# Patient Record
Sex: Female | Born: 1945 | Race: White | Hispanic: No | Marital: Married | Smoking: Former smoker
Health system: Southern US, Community
[De-identification: ages and names within clinical notes are randomized; demographics above are authoritative.]

## PROBLEM LIST (undated history)

## (undated) DIAGNOSIS — D649 Anemia, unspecified: Secondary | ICD-10-CM

## (undated) DIAGNOSIS — R51 Headache: Secondary | ICD-10-CM

## (undated) DIAGNOSIS — G4730 Sleep apnea, unspecified: Secondary | ICD-10-CM

## (undated) DIAGNOSIS — K5730 Diverticulosis of large intestine without perforation or abscess without bleeding: Secondary | ICD-10-CM

## (undated) DIAGNOSIS — F419 Anxiety disorder, unspecified: Secondary | ICD-10-CM

## (undated) DIAGNOSIS — Z9889 Other specified postprocedural states: Secondary | ICD-10-CM

## (undated) DIAGNOSIS — L409 Psoriasis, unspecified: Secondary | ICD-10-CM

## (undated) DIAGNOSIS — M8580 Other specified disorders of bone density and structure, unspecified site: Secondary | ICD-10-CM

## (undated) DIAGNOSIS — K219 Gastro-esophageal reflux disease without esophagitis: Secondary | ICD-10-CM

## (undated) DIAGNOSIS — R112 Nausea with vomiting, unspecified: Secondary | ICD-10-CM

## (undated) DIAGNOSIS — Z8489 Family history of other specified conditions: Secondary | ICD-10-CM

## (undated) DIAGNOSIS — E278 Other specified disorders of adrenal gland: Secondary | ICD-10-CM

## (undated) DIAGNOSIS — M1990 Unspecified osteoarthritis, unspecified site: Secondary | ICD-10-CM

## (undated) DIAGNOSIS — Z87442 Personal history of urinary calculi: Secondary | ICD-10-CM

## (undated) DIAGNOSIS — E785 Hyperlipidemia, unspecified: Secondary | ICD-10-CM

## (undated) DIAGNOSIS — R21 Rash and other nonspecific skin eruption: Secondary | ICD-10-CM

## (undated) DIAGNOSIS — H269 Unspecified cataract: Secondary | ICD-10-CM

## (undated) DIAGNOSIS — I10 Essential (primary) hypertension: Secondary | ICD-10-CM

## (undated) DIAGNOSIS — Z8679 Personal history of other diseases of the circulatory system: Secondary | ICD-10-CM

## (undated) HISTORY — DX: Unspecified cataract: H26.9

## (undated) HISTORY — PX: CHOLECYSTECTOMY: SHX55

## (undated) HISTORY — PX: CYSTOSTOMY W/ BLADDER BIOPSY: SHX1431

## (undated) HISTORY — PX: OTHER SURGICAL HISTORY: SHX169

## (undated) HISTORY — DX: Other specified disorders of bone density and structure, unspecified site: M85.80

## (undated) HISTORY — DX: Hyperlipidemia, unspecified: E78.5

## (undated) HISTORY — DX: Psoriasis, unspecified: L40.9

## (undated) HISTORY — DX: Headache: R51

## (undated) HISTORY — PX: DILATION AND CURETTAGE OF UTERUS: SHX78

## (undated) HISTORY — DX: Gastro-esophageal reflux disease without esophagitis: K21.9

## (undated) HISTORY — DX: Unspecified osteoarthritis, unspecified site: M19.90

## (undated) HISTORY — DX: Anemia, unspecified: D64.9

## (undated) HISTORY — DX: Sleep apnea, unspecified: G47.30

## (undated) HISTORY — DX: Diverticulosis of large intestine without perforation or abscess without bleeding: K57.30

---

## 2000-06-24 HISTORY — PX: BLEPHAROPLASTY: SUR158

## 2001-06-24 HISTORY — PX: BREAST EXCISIONAL BIOPSY: SUR124

## 2003-11-17 ENCOUNTER — Other Ambulatory Visit: Admission: RE | Admit: 2003-11-17 | Discharge: 2003-11-17 | Payer: Self-pay | Admitting: Obstetrics and Gynecology

## 2004-07-13 ENCOUNTER — Ambulatory Visit: Payer: Self-pay | Admitting: Family Medicine

## 2004-07-16 ENCOUNTER — Ambulatory Visit: Payer: Self-pay | Admitting: Family Medicine

## 2004-08-22 ENCOUNTER — Encounter: Admission: RE | Admit: 2004-08-22 | Discharge: 2004-08-22 | Payer: Self-pay

## 2005-04-17 ENCOUNTER — Ambulatory Visit: Payer: Self-pay | Admitting: Family Medicine

## 2005-04-24 ENCOUNTER — Ambulatory Visit: Payer: Self-pay | Admitting: Family Medicine

## 2005-08-29 ENCOUNTER — Other Ambulatory Visit: Admission: RE | Admit: 2005-08-29 | Discharge: 2005-08-29 | Payer: Self-pay | Admitting: Obstetrics and Gynecology

## 2005-08-30 ENCOUNTER — Ambulatory Visit: Payer: Self-pay | Admitting: Family Medicine

## 2005-09-11 ENCOUNTER — Encounter: Admission: RE | Admit: 2005-09-11 | Discharge: 2005-09-11 | Payer: Self-pay | Admitting: Obstetrics and Gynecology

## 2005-09-25 ENCOUNTER — Ambulatory Visit: Payer: Self-pay | Admitting: Family Medicine

## 2005-09-30 ENCOUNTER — Encounter: Admission: RE | Admit: 2005-09-30 | Discharge: 2005-09-30 | Payer: Self-pay | Admitting: Obstetrics and Gynecology

## 2005-10-18 ENCOUNTER — Encounter (INDEPENDENT_AMBULATORY_CARE_PROVIDER_SITE_OTHER): Payer: Self-pay | Admitting: *Deleted

## 2005-10-18 ENCOUNTER — Encounter: Admission: RE | Admit: 2005-10-18 | Discharge: 2005-10-18 | Payer: Self-pay | Admitting: Obstetrics and Gynecology

## 2005-10-24 ENCOUNTER — Ambulatory Visit: Payer: Self-pay | Admitting: Family Medicine

## 2006-04-08 ENCOUNTER — Ambulatory Visit: Payer: Self-pay | Admitting: Gastroenterology

## 2006-06-24 HISTORY — PX: COLONOSCOPY: SHX174

## 2006-07-03 ENCOUNTER — Encounter (INDEPENDENT_AMBULATORY_CARE_PROVIDER_SITE_OTHER): Payer: Self-pay | Admitting: *Deleted

## 2006-07-03 ENCOUNTER — Ambulatory Visit: Payer: Self-pay | Admitting: Gastroenterology

## 2006-08-22 ENCOUNTER — Encounter: Admission: RE | Admit: 2006-08-22 | Discharge: 2006-08-22 | Payer: Self-pay | Admitting: Obstetrics and Gynecology

## 2006-09-10 ENCOUNTER — Ambulatory Visit: Payer: Self-pay | Admitting: Family Medicine

## 2006-09-11 LAB — CONVERTED CEMR LAB
BUN: 12 mg/dL (ref 6–23)
CO2: 28 meq/L (ref 19–32)
Calcium: 9.4 mg/dL (ref 8.4–10.5)
Chloride: 97 meq/L (ref 96–112)
GFR calc Af Amer: 82 mL/min
GFR calc non Af Amer: 68 mL/min
Glucose, Bld: 89 mg/dL (ref 70–99)
Potassium: 4.2 meq/L (ref 3.5–5.1)

## 2006-09-16 ENCOUNTER — Encounter: Payer: Self-pay | Admitting: Family Medicine

## 2006-09-16 LAB — CONVERTED CEMR LAB
Norepinephrine 24 Hr Urine: 35 mcg/24hr (ref <80)
Volume, Urine-CORTUR: 3930 mL

## 2006-09-29 ENCOUNTER — Encounter: Admission: RE | Admit: 2006-09-29 | Discharge: 2006-09-29 | Payer: Self-pay | Admitting: Obstetrics and Gynecology

## 2006-11-05 ENCOUNTER — Ambulatory Visit: Payer: Self-pay | Admitting: Family Medicine

## 2006-11-05 LAB — CONVERTED CEMR LAB
Basophils Absolute: 0.1 10*3/uL (ref 0.0–0.1)
Basophils Relative: 0.8 % (ref 0.0–1.0)
Cholesterol: 157 mg/dL (ref 0–200)
Eosinophils Absolute: 0.2 10*3/uL (ref 0.0–0.6)
Eosinophils Relative: 2.4 % (ref 0.0–5.0)
Glucose, 1 Hour GTT: 125 mg/dL (ref 120–170)
Glucose, 2 hour: 105 mg/dL (ref 70–139)
Glucose, Fasting: 106 mg/dL — ABNORMAL HIGH (ref 70–99)
Glucose, GTT - 3 Hour: 100 mg/dL
HCT: 36.8 % (ref 36.0–46.0)
HDL: 42.2 mg/dL (ref >39.0)
Hemoglobin: 12.7 g/dL (ref 12.0–15.0)
LDL Cholesterol: 84 mg/dL (ref 0–99)
Lymphocytes Relative: 31.2 % (ref 12.0–46.0)
MCHC: 34.6 g/dL (ref 30.0–36.0)
MCV: 91.1 fL (ref 78.0–100.0)
Monocytes Absolute: 0.5 10*3/uL (ref 0.2–0.7)
Monocytes Relative: 6.7 % (ref 3.0–11.0)
Neutro Abs: 4.4 10*3/uL (ref 1.4–7.7)
Neutrophils Relative %: 58.9 % (ref 43.0–77.0)
Platelets: 341 10*3/uL (ref 150–400)
RBC: 4.04 M/uL (ref 3.87–5.11)
RDW: 12.2 % (ref 11.5–14.6)
Total CHOL/HDL Ratio: 3.7
Triglycerides: 153 mg/dL — ABNORMAL HIGH (ref 0–149)
VLDL: 31 mg/dL (ref 0–40)
WBC: 7.6 10*3/uL (ref 4.5–10.5)

## 2006-11-10 ENCOUNTER — Encounter: Payer: Self-pay | Admitting: Family Medicine

## 2006-11-10 DIAGNOSIS — L408 Other psoriasis: Secondary | ICD-10-CM

## 2006-11-10 DIAGNOSIS — N63 Unspecified lump in unspecified breast: Secondary | ICD-10-CM | POA: Insufficient documentation

## 2006-11-10 DIAGNOSIS — M6740 Ganglion, unspecified site: Secondary | ICD-10-CM | POA: Insufficient documentation

## 2006-11-10 DIAGNOSIS — M899 Disorder of bone, unspecified: Secondary | ICD-10-CM | POA: Insufficient documentation

## 2006-11-10 DIAGNOSIS — M949 Disorder of cartilage, unspecified: Secondary | ICD-10-CM

## 2006-11-10 DIAGNOSIS — K5730 Diverticulosis of large intestine without perforation or abscess without bleeding: Secondary | ICD-10-CM | POA: Insufficient documentation

## 2006-11-10 DIAGNOSIS — D649 Anemia, unspecified: Secondary | ICD-10-CM

## 2006-11-10 DIAGNOSIS — J45909 Unspecified asthma, uncomplicated: Secondary | ICD-10-CM | POA: Insufficient documentation

## 2006-11-10 DIAGNOSIS — G4730 Sleep apnea, unspecified: Secondary | ICD-10-CM | POA: Insufficient documentation

## 2007-01-26 ENCOUNTER — Ambulatory Visit: Payer: Self-pay | Admitting: Family Medicine

## 2007-01-26 DIAGNOSIS — M810 Age-related osteoporosis without current pathological fracture: Secondary | ICD-10-CM | POA: Insufficient documentation

## 2007-12-22 ENCOUNTER — Telehealth: Payer: Self-pay | Admitting: Family Medicine

## 2007-12-29 ENCOUNTER — Ambulatory Visit: Payer: Self-pay | Admitting: Family Medicine

## 2007-12-29 DIAGNOSIS — R51 Headache: Secondary | ICD-10-CM

## 2007-12-29 DIAGNOSIS — M1990 Unspecified osteoarthritis, unspecified site: Secondary | ICD-10-CM | POA: Insufficient documentation

## 2007-12-29 DIAGNOSIS — I10 Essential (primary) hypertension: Secondary | ICD-10-CM | POA: Insufficient documentation

## 2007-12-29 DIAGNOSIS — Z87442 Personal history of urinary calculi: Secondary | ICD-10-CM | POA: Insufficient documentation

## 2007-12-29 DIAGNOSIS — R519 Headache, unspecified: Secondary | ICD-10-CM | POA: Insufficient documentation

## 2007-12-29 DIAGNOSIS — E785 Hyperlipidemia, unspecified: Secondary | ICD-10-CM

## 2008-01-08 ENCOUNTER — Ambulatory Visit: Payer: Self-pay | Admitting: Family Medicine

## 2008-01-11 LAB — CONVERTED CEMR LAB
Bilirubin Urine: NEGATIVE
Glucose, Urine, Semiquant: NEGATIVE
Ketones, urine, test strip: NEGATIVE
Nitrite: NEGATIVE
Protein, U semiquant: NEGATIVE
Specific Gravity, Urine: 1.01
Urobilinogen, UA: 0.2
pH: 7

## 2008-01-18 LAB — CONVERTED CEMR LAB
ALT: 19 units/L (ref 0–35)
AST: 23 units/L (ref 0–37)
Albumin: 4.1 g/dL (ref 3.5–5.2)
Alkaline Phosphatase: 64 units/L (ref 39–117)
BUN: 10 mg/dL (ref 6–23)
Basophils Absolute: 0.1 10*3/uL (ref 0.0–0.1)
Basophils Relative: 1.2 % (ref 0.0–3.0)
Bilirubin, Direct: 0.1 mg/dL (ref 0.0–0.3)
CO2: 30 meq/L (ref 19–32)
Calcium: 9.7 mg/dL (ref 8.4–10.5)
Chloride: 97 meq/L (ref 96–112)
Cholesterol: 168 mg/dL (ref 0–200)
Creatinine, Ser: 1 mg/dL (ref 0.4–1.2)
Eosinophils Absolute: 0.1 10*3/uL (ref 0.0–0.7)
Eosinophils Relative: 2.2 % (ref 0.0–5.0)
GFR calc Af Amer: 73 mL/min
GFR calc non Af Amer: 60 mL/min
Glucose, Bld: 101 mg/dL — ABNORMAL HIGH (ref 70–99)
HCT: 38.8 % (ref 36.0–46.0)
HDL: 40.9 mg/dL (ref >39.0)
Hemoglobin: 13.3 g/dL (ref 12.0–15.0)
LDL Cholesterol: 97 mg/dL (ref 0–99)
Lymphocytes Relative: 33.5 % (ref 12.0–46.0)
MCHC: 34.3 g/dL (ref 30.0–36.0)
MCV: 94 fL (ref 78.0–100.0)
Monocytes Absolute: 0.4 10*3/uL (ref 0.1–1.0)
Monocytes Relative: 8.1 % (ref 3.0–12.0)
Neutro Abs: 3.1 10*3/uL (ref 1.4–7.7)
Neutrophils Relative %: 55 % (ref 43.0–77.0)
Platelets: 368 10*3/uL (ref 150–400)
Potassium: 4.2 meq/L (ref 3.5–5.1)
RBC: 4.13 M/uL (ref 3.87–5.11)
RDW: 12 % (ref 11.5–14.6)
Sodium: 134 meq/L — ABNORMAL LOW (ref 135–145)
TSH: 3.66 microintl units/mL (ref 0.35–5.50)
Total Bilirubin: 0.9 mg/dL (ref 0.3–1.2)
Total CHOL/HDL Ratio: 4.1
Total Protein: 7.5 g/dL (ref 6.0–8.3)
Triglycerides: 152 mg/dL — ABNORMAL HIGH (ref 0–149)
VLDL: 30 mg/dL (ref 0–40)
WBC: 5.5 10*3/uL (ref 4.5–10.5)

## 2008-03-22 ENCOUNTER — Encounter: Payer: Self-pay | Admitting: *Deleted

## 2008-03-29 ENCOUNTER — Encounter: Admission: RE | Admit: 2008-03-29 | Discharge: 2008-03-29 | Payer: Self-pay | Admitting: Obstetrics and Gynecology

## 2008-10-03 ENCOUNTER — Ambulatory Visit: Payer: Self-pay | Admitting: Family Medicine

## 2008-10-03 DIAGNOSIS — J209 Acute bronchitis, unspecified: Secondary | ICD-10-CM

## 2009-03-13 ENCOUNTER — Ambulatory Visit: Payer: Self-pay | Admitting: Family Medicine

## 2009-03-15 ENCOUNTER — Telehealth: Payer: Self-pay | Admitting: Family Medicine

## 2009-03-30 ENCOUNTER — Ambulatory Visit: Payer: Self-pay | Admitting: Family Medicine

## 2009-03-30 ENCOUNTER — Encounter: Admission: RE | Admit: 2009-03-30 | Discharge: 2009-03-30 | Payer: Self-pay | Admitting: Obstetrics and Gynecology

## 2009-04-04 ENCOUNTER — Ambulatory Visit: Payer: Self-pay | Admitting: Family Medicine

## 2009-04-04 LAB — CONVERTED CEMR LAB
ALT: 17 units/L (ref 0–35)
AST: 24 units/L (ref 0–37)
Albumin: 4.3 g/dL (ref 3.5–5.2)
Alkaline Phosphatase: 58 units/L (ref 39–117)
BUN: 13 mg/dL (ref 6–23)
Basophils Absolute: 0 10*3/uL (ref 0.0–0.1)
Basophils Relative: 0.6 % (ref 0.0–3.0)
Bilirubin Urine: NEGATIVE
Bilirubin, Direct: 0.1 mg/dL (ref 0.0–0.3)
CO2: 29 meq/L (ref 19–32)
Calcium: 9.6 mg/dL (ref 8.4–10.5)
Chloride: 99 meq/L (ref 96–112)
Cholesterol: 143 mg/dL (ref 0–200)
Creatinine, Ser: 1 mg/dL (ref 0.4–1.2)
Eosinophils Absolute: 0.1 10*3/uL (ref 0.0–0.7)
Eosinophils Relative: 1.6 % (ref 0.0–5.0)
GFR calc non Af Amer: 59.77 mL/min (ref >60)
Glucose, Bld: 93 mg/dL (ref 70–99)
Glucose, Urine, Semiquant: NEGATIVE
HCT: 36.6 % (ref 36.0–46.0)
HDL: 46.2 mg/dL (ref >39.00)
Hemoglobin: 12.8 g/dL (ref 12.0–15.0)
Ketones, urine, test strip: NEGATIVE
LDL Cholesterol: 75 mg/dL (ref 0–99)
Lymphocytes Relative: 35.7 % (ref 12.0–46.0)
Lymphs Abs: 2 10*3/uL (ref 0.7–4.0)
MCHC: 35 g/dL (ref 30.0–36.0)
MCV: 95.4 fL (ref 78.0–100.0)
Monocytes Absolute: 0.4 10*3/uL (ref 0.1–1.0)
Monocytes Relative: 7.1 % (ref 3.0–12.0)
Neutro Abs: 3 10*3/uL (ref 1.4–7.7)
Neutrophils Relative %: 55 % (ref 43.0–77.0)
Nitrite: NEGATIVE
Platelets: 338 10*3/uL (ref 150.0–400.0)
Potassium: 3.8 meq/L (ref 3.5–5.1)
Protein, U semiquant: NEGATIVE
RBC: 3.84 M/uL — ABNORMAL LOW (ref 3.87–5.11)
RDW: 11.8 % (ref 11.5–14.6)
Sodium: 133 meq/L — ABNORMAL LOW (ref 135–145)
Specific Gravity, Urine: 1.01
TSH: 2.35 microintl units/mL (ref 0.35–5.50)
Total Bilirubin: 0.7 mg/dL (ref 0.3–1.2)
Total CHOL/HDL Ratio: 3
Total Protein: 7.4 g/dL (ref 6.0–8.3)
Triglycerides: 108 mg/dL (ref 0.0–149.0)
Urobilinogen, UA: 0.2
VLDL: 21.6 mg/dL (ref 0.0–40.0)
WBC: 5.5 10*3/uL (ref 4.5–10.5)
pH: 6

## 2009-04-05 ENCOUNTER — Encounter: Payer: Self-pay | Admitting: Family Medicine

## 2009-04-11 ENCOUNTER — Ambulatory Visit: Payer: Self-pay | Admitting: Family Medicine

## 2009-04-11 ENCOUNTER — Telehealth: Payer: Self-pay | Admitting: Family Medicine

## 2009-04-11 DIAGNOSIS — J069 Acute upper respiratory infection, unspecified: Secondary | ICD-10-CM | POA: Insufficient documentation

## 2009-06-09 ENCOUNTER — Emergency Department (HOSPITAL_COMMUNITY): Admission: EM | Admit: 2009-06-09 | Discharge: 2009-06-09 | Payer: Self-pay | Admitting: Emergency Medicine

## 2009-06-14 HISTORY — PX: OTHER SURGICAL HISTORY: SHX169

## 2009-08-29 ENCOUNTER — Ambulatory Visit: Payer: Self-pay | Admitting: Family Medicine

## 2009-08-29 DIAGNOSIS — T887XXA Unspecified adverse effect of drug or medicament, initial encounter: Secondary | ICD-10-CM | POA: Insufficient documentation

## 2010-02-19 ENCOUNTER — Telehealth: Payer: Self-pay | Admitting: Family Medicine

## 2010-04-02 ENCOUNTER — Encounter: Admission: RE | Admit: 2010-04-02 | Discharge: 2010-04-02 | Payer: Self-pay | Admitting: Obstetrics and Gynecology

## 2010-04-02 ENCOUNTER — Ambulatory Visit: Payer: Self-pay | Admitting: Family Medicine

## 2010-04-04 ENCOUNTER — Ambulatory Visit: Payer: Self-pay | Admitting: Family Medicine

## 2010-04-04 DIAGNOSIS — R319 Hematuria, unspecified: Secondary | ICD-10-CM

## 2010-04-04 LAB — CONVERTED CEMR LAB
Bilirubin Urine: NEGATIVE
Ketones, urine, test strip: NEGATIVE
Nitrite: NEGATIVE
Protein, U semiquant: NEGATIVE
Specific Gravity, Urine: 1.01
Urobilinogen, UA: 0.2
WBC Urine, dipstick: NEGATIVE

## 2010-04-05 LAB — CONVERTED CEMR LAB
ALT: 15 units/L (ref 0–35)
AST: 24 units/L (ref 0–37)
Albumin: 4.1 g/dL (ref 3.5–5.2)
Alkaline Phosphatase: 57 units/L (ref 39–117)
BUN: 16 mg/dL (ref 6–23)
Basophils Relative: 0.8 % (ref 0.0–3.0)
Bilirubin, Direct: 0.1 mg/dL (ref 0.0–0.3)
CO2: 32 meq/L (ref 19–32)
Calcium: 9.6 mg/dL (ref 8.4–10.5)
Chloride: 98 meq/L (ref 96–112)
Cholesterol: 137 mg/dL (ref 0–200)
Eosinophils Absolute: 0.1 10*3/uL (ref 0.0–0.7)
Eosinophils Relative: 1.6 % (ref 0.0–5.0)
GFR calc non Af Amer: 57.57 mL/min (ref >60)
Hemoglobin: 13.1 g/dL (ref 12.0–15.0)
LDL Cholesterol: 67 mg/dL (ref 0–99)
Lymphocytes Relative: 29.4 % (ref 12.0–46.0)
MCHC: 34.5 g/dL (ref 30.0–36.0)
MCV: 96.1 fL (ref 78.0–100.0)
Monocytes Absolute: 0.4 10*3/uL (ref 0.1–1.0)
Neutro Abs: 4.1 10*3/uL (ref 1.4–7.7)
Neutrophils Relative %: 61.8 % (ref 43.0–77.0)
Platelets: 378 10*3/uL (ref 150.0–400.0)
Potassium: 5.2 meq/L — ABNORMAL HIGH (ref 3.5–5.1)
RBC: 3.94 M/uL (ref 3.87–5.11)
Sodium: 137 meq/L (ref 135–145)
Total Bilirubin: 0.6 mg/dL (ref 0.3–1.2)
Total CHOL/HDL Ratio: 3
Total Protein: 7.2 g/dL (ref 6.0–8.3)
VLDL: 19.4 mg/dL (ref 0.0–40.0)
WBC: 6.6 10*3/uL (ref 4.5–10.5)

## 2010-04-30 ENCOUNTER — Encounter: Payer: Self-pay | Admitting: Family Medicine

## 2010-05-30 ENCOUNTER — Encounter: Payer: Self-pay | Admitting: Family Medicine

## 2010-07-05 ENCOUNTER — Telehealth: Payer: Self-pay | Admitting: Gastroenterology

## 2010-07-09 ENCOUNTER — Encounter: Payer: Self-pay | Admitting: Internal Medicine

## 2010-07-23 ENCOUNTER — Encounter (INDEPENDENT_AMBULATORY_CARE_PROVIDER_SITE_OTHER): Payer: Self-pay

## 2010-07-24 ENCOUNTER — Ambulatory Visit
Admission: RE | Admit: 2010-07-24 | Discharge: 2010-07-24 | Payer: Self-pay | Source: Home / Self Care | Attending: Gastroenterology | Admitting: Gastroenterology

## 2010-07-24 ENCOUNTER — Ambulatory Visit
Admission: RE | Admit: 2010-07-24 | Discharge: 2010-07-24 | Payer: Self-pay | Source: Home / Self Care | Attending: Family Medicine | Admitting: Family Medicine

## 2010-07-25 ENCOUNTER — Telehealth (INDEPENDENT_AMBULATORY_CARE_PROVIDER_SITE_OTHER): Payer: Self-pay | Admitting: *Deleted

## 2010-07-26 NOTE — Progress Notes (Signed)
Summary: Pt req to get a script for Eppi-pen for bee stings  Phone Note Call from Patient Call back at Home Phone [phone]   Caller: Patient Summary of Call: Pt called and had gotten stung by some yellow jackets a fews ago, and had a bad reaction to stings. Pt is req to get a script for Eppi-pen. [facility] [address]    Initial call taken by: Lucy Antigua,  February 19, 2010 10:23 AM  Follow-up for Phone Call        please call this in Follow-up by: Nelwyn Salisbury MD,  February 19, 2010 1:16 PM    New/Updated Medications: EPIPEN 0.3 MG/0.3ML DEVI (EPINEPHRINE) UAD Prescriptions: EPIPEN 0.3 MG/0.3ML DEVI (EPINEPHRINE) UAD  #1 x 1   Entered by:   Raechel Ache, RN   Authorized by:   Nelwyn Salisbury MD   Signed by:   Raechel Ache, RN on 02/19/2010   Method used:   [facility]        [facility]  [phone]* (retail)       [address] [place], [place]  [address]       Ph: [phone] or [phone]       Fax: [phone]   RxID:   [id number]

## 2010-07-26 NOTE — Letter (Signed)
Summary: [facility]  [facility]   Imported By: Maryln Gottron 06/07/2010 14:23:06  _____________________________________________________________________  External Attachment:    Type:   Image     Comment:   External Document

## 2010-07-26 NOTE — Letter (Signed)
Summary: Pre Visit Letter Revised  [facility]  [address] [place], [place] [address]   Phone: [phone]  Fax: [phone]        07/09/2010 MRN: [id number] Angela Sheppard [address] [place], [place]  [address]             Procedure Date:  08/01/2010    Welcome to the Gastroenterology Division at [facility].    You are scheduled to see a nurse for your pre-procedure visit on 07/24/10 at 9:00 a.m. on the 3rd floor at [facility], [address]  We ask that you try to arrive at our office 15 minutes prior to your appointment time to allow for check-in.  Please take a minute to review the attached form.  If you answer "Yes" to one or more of the questions on the first page, we ask that you call the person listed at your earliest opportunity.  If you answer "No" to all of the questions, please complete the rest of the form and bring it to your appointment.    Your nurse visit will consist of discussing your medical and surgical history, your immediate family medical history, and your medications.   If you are unable to list all of your medications on the form, please bring the medication bottles to your appointment and we will list them.  We will need to be aware of both prescribed and over the counter drugs.  We will need to know exact dosage information as well.    Please be prepared to read and sign documents such as consent forms, a financial agreement, and acknowledgement forms.  If necessary, and with your consent, a friend or relative is welcome to sit-in on the nurse visit with you.  Please bring your insurance card so that we may make a copy of it.  If your insurance requires a referral to see a specialist, please bring your referral form from your primary care physician.  No co-pay is required for this nurse visit.     If you cannot keep your appointment, please call [phone] to cancel or reschedule prior to your appointment date.  This  allows [place] the opportunity to schedule an appointment for another patient in need of care.    Thank you for choosing [facility] for your medical needs.  We appreciate the opportunity to care for you.  Please visit [place] at our website  to learn more about our practice.  Sincerely, The Gastroenterology Division

## 2010-07-26 NOTE — Consult Note (Signed)
Summary: [facility]  [facility]   Imported By: Maryln Gottron 05/07/2010 15:19:38  _____________________________________________________________________  External Attachment:    Type:   Image     Comment:   External Document

## 2010-07-26 NOTE — Assessment & Plan Note (Signed)
Summary: asthma flare-up//lch   Vital Signs:  Patient profile:   65 year old female Weight:      197 pounds O2 Sat:      92 % Temp:     97.7 degrees F Pulse rate:   76 / minute BP sitting:   130 / 84  (left arm) Cuff size:   large  Vitals Entered By: Pura Spice, RN (April 02, 2010 9:32 AM) CC: asthma flare up ventolin helped needs refill on combivent and ventolin    History of Present Illness: Here for refils. She is doing well except for an occasional spell of wheezing. She gets excellent and prompt relief from Proair at these times. BP is stable.   Allergies: 1)  ! Bactrim (Sulfamethoxazole-Trimethoprim) 2)  ! Penicillin V Potassium (Penicillin V Potassium) 3)  ! Cefdinir (Cefdinir)  Past History:  Past Medical History: Reviewed history from 12/29/2007 and no changes required. Anemia-NOS Osteopenia Asthma Sleep apnea Diverticulosis, colon Headache Nephrolithiasis, hx of Osteoarthritis psoriasis  Review of Systems  The patient denies anorexia, fever, weight loss, weight gain, vision loss, decreased hearing, hoarseness, chest pain, syncope, dyspnea on exertion, peripheral edema, prolonged cough, headaches, hemoptysis, abdominal pain, melena, hematochezia, severe indigestion/heartburn, hematuria, incontinence, genital sores, muscle weakness, suspicious skin lesions, transient blindness, difficulty walking, depression, unusual weight change, abnormal bleeding, enlarged lymph nodes, angioedema, breast masses, and testicular masses.         Flu Vaccine Consent Questions     Do you have a history of severe allergic reactions to this vaccine? no    Any prior history of allergic reactions to egg and/or gelatin? no    Do you have a sensitivity to the preservative Thimersol? no    Do you have a past history of Guillan-Barre Syndrome? no    Do you currently have an acute febrile illness? no    Have you ever had a severe reaction to latex? no    Vaccine information given  and explained to patient? yes    Are you currently pregnant? no    Lot Number:[id number]   Exp Date:12/22/2010   Site Given  Left Deltoid IM Pura Spice, RN  April 02, 2010 12:05 PM   Physical Exam  General:  Well-developed,well-nourished,in no acute distress; alert,appropriate and cooperative throughout examination Neck:  No deformities, masses, or tenderness noted. Lungs:  Normal respiratory effort, chest expands symmetrically. Lungs are clear to auscultation, no crackles or wheezes. Heart:  Normal rate and regular rhythm. S1 and S2 normal without gallop, murmur, click, rub or other extra sounds.   Impression & Recommendations:  Problem # 1:  ESSENTIAL HYPERTENSION (ICD-401.9)  Her updated medication list for this problem includes:    Benazepril-hydrochlorothiazide 20-25 Mg Tabs (Benazepril-hydrochlorothiazide) ..... Once daily  Problem # 2:  ASTHMA (ICD-493.90)  Her updated medication list for this problem includes:    Singulair 10 Mg Tabs (Montelukast sodium) .Marland Kitchen... 1 by mouth daily    Proair Hfa 108 (90 Base) Mcg/act Aers (Albuterol sulfate) .Marland Kitchen... 2 puffs as needed    Combivent 103-18 Mcg/act Aero (Ipratropium-albuterol) ..... Inhale 2 puffs three times a day as needed for sob  Complete Medication List: 1)  Lipitor 20 Mg Tabs (Atorvastatin calcium) .... Once daily 2)  Aspir-low 81 Mg Tbec (Aspirin) .Marland Kitchen.. 1 by mouth once daily 3)  Actonel 150 Mg Tabs (Risedronate sodium) .Marland Kitchen.. 1 a month 4)  Singulair 10 Mg Tabs (Montelukast sodium) .Marland Kitchen.. 1 by mouth daily 5)  Proair Hfa 108 (90 Base)  Mcg/act Aers (Albuterol sulfate) .... 2 puffs as needed 6)  Calcium Carbonate-vitamin D 600-400 Mg-unit Tabs (Calcium carbonate-vitamin d) .Marland Kitchen.. 1 by mouth once daily 7)  Combivent 103-18 Mcg/act Aero (Ipratropium-albuterol) .... Inhale 2 puffs three times a day as needed for sob 8)  Benazepril-hydrochlorothiazide 20-25 Mg Tabs (Benazepril-hydrochlorothiazide) .... Once daily 9)  Epipen 0.3  Mg/0.28ml Devi (Epinephrine) .... Uad  Other Orders: Admin 1st Vaccine (84166) Flu Vaccine 102yrs + ([address])  Patient Instructions: 1)  Please schedule a follow-up appointment as needed .  Prescriptions: COMBIVENT 103-18 MCG/ACT AERO (IPRATROPIUM-ALBUTEROL) inhale 2 puffs three times a day as needed for sob  #30 x 11   Entered and Authorized by:   Nelwyn Salisbury MD   Signed by:   Nelwyn Salisbury MD on 04/02/2010   Method used:   [facility]  [address]  [phone]* (retail)       [address] [place], [place]  [address]       Ph: [phone] or [phone]       Fax: [phone]   RxID:   [id number] PROAIR HFA 108 (90 BASE) MCG/ACT  AERS (ALBUTEROL SULFATE) 2 puffs as needed  #30 x 11   Entered and Authorized by:   Nelwyn Salisbury MD   Signed by:   Nelwyn Salisbury MD on 04/02/2010   Method used:   [facility]        [facility]  [phone]* (retail)       [address] [place], [place]  [address]       Ph: [phone] or [phone]       Fax: [phone]   RxID:   [id number] SINGULAIR 10 MG TABS (MONTELUKAST SODIUM) 1 by mouth daily  #30 x 11   Entered and Authorized by:   Nelwyn Salisbury MD   Signed by:   Nelwyn Salisbury MD on 04/02/2010   Method used:   [facility]        [facility]  [phone]* (retail)       [address] [place], [place]  [address]       Ph: [phone] or [phone]       Fax: [phone]   RxID:   [id number] ACTONEL 150 MG  TABS (RISEDRONATE SODIUM) 1 a month  #1 x 11   Entered and Authorized by:   Nelwyn Salisbury MD   Signed by:   Nelwyn Salisbury MD on 04/02/2010   Method used:   [facility]        [facility]  [phone]* (retail)       [address] [place], [place]  [address]       Ph: [phone] or [phone]       Fax: [phone]   RxID:   [id number] LIPITOR 20 MG TABS (ATORVASTATIN CALCIUM) once daily  #30 x 11   Entered and Authorized by:   Nelwyn Salisbury  MD   Signed by:   Nelwyn Salisbury MD on 04/02/2010   Method used:   [facility]        [facility]  [phone]* (retail)       [address] [place], [place]  [address]       Ph: [phone]  or [phone]       Fax: [phone]   RxID:   [id number]

## 2010-07-26 NOTE — Assessment & Plan Note (Signed)
Summary: er fup-drug interaction//ccm   Vital Signs:  Patient profile:   65 year old female Weight:      197 pounds Temp:     98.1 degrees F oral BP sitting:   126 / 94  (left arm) Cuff size:   regular  Vitals Entered By: Tomma Lightning, RN CC: F/u Urgent Care - has 2 sprained toes. Had CXR done and put on abx, but had reaction. BP has been high.   History of Present Illness: here for 2 reasons. First she has had chesyt congesiton and a dry cough for several weeks. She saw Urgent Care over the weekend and was diagnosed with bronchitis by CXR. Given Cefdinir, but she stopped this after a single dose because of a rash that developed over the face and neck. The rash has disappeared. No fever. Also her BP at home stays in the 90s diastolic while the systolic reading is stable.   Allergies (verified): 1)  ! Bactrim (Sulfamethoxazole-Trimethoprim) 2)  ! Penicillin V Potassium (Penicillin V Potassium) 3)  ! Cefdinir (Cefdinir)  Past History:  Past Medical History: Reviewed history from 12/29/2007 and no changes required. Anemia-NOS Osteopenia Asthma Sleep apnea Diverticulosis, colon Headache Nephrolithiasis, hx of Osteoarthritis psoriasis  Past Surgical History: Cholecystectomy Needle biopsy lt breast and rt breast excision lesion lt ear D&C Blepharoplasty bladder biopsy ganglions lt wrist colonoscopy 2008 per Dr. Russella Dar, repeat in 3 yrs repair of fractured left radius with plate and screws 06-14-09 per Dr. Jodelle Gross  Review of Systems  The patient denies anorexia, fever, weight loss, weight gain, vision loss, decreased hearing, hoarseness, chest pain, syncope, dyspnea on exertion, peripheral edema, headaches, hemoptysis, abdominal pain, melena, hematochezia, severe indigestion/heartburn, hematuria, incontinence, genital sores, muscle weakness, suspicious skin lesions, transient blindness, difficulty walking, depression, unusual weight change, abnormal bleeding, enlarged lymph  nodes, angioedema, breast masses, and testicular masses.    Physical Exam  General:  Well-developed,well-nourished,in no acute distress; alert,appropriate and cooperative throughout examination Head:  Normocephalic and atraumatic without obvious abnormalities. No apparent alopecia or balding. Eyes:  No corneal or conjunctival inflammation noted. EOMI. Perrla. Funduscopic exam benign, without hemorrhages, exudates or papilledema. Vision grossly normal. Ears:  External ear exam shows no significant lesions or deformities.  Otoscopic examination reveals clear canals, tympanic membranes are intact bilaterally without bulging, retraction, inflammation or discharge. Hearing is grossly normal bilaterally. Nose:  External nasal examination shows no deformity or inflammation. Nasal mucosa are pink and moist without lesions or exudates. Mouth:  Oral mucosa and oropharynx without lesions or exudates.  Teeth in good repair. Neck:  No deformities, masses, or tenderness noted. Lungs:  Normal respiratory effort, chest expands symmetrically. Lungs are clear to auscultation, no crackles or wheezes. Skin:  Intact without suspicious lesions or rashes   Impression & Recommendations:  Problem # 1:  ACUTE BRONCHITIS (ICD-466.0)  Her updated medication list for this problem includes:    Singulair 10 Mg Tabs (Montelukast sodium) .Marland Kitchen... 1 by mouth daily    Proair Hfa 108 (90 Base) Mcg/act Aers (Albuterol sulfate) .Marland Kitchen... 2 puffs as needed    Combivent 103-18 Mcg/act Aero (Ipratropium-albuterol) ..... Inhale 2 puffs three times a day as needed for sob    Zithromax Z-pak 250 Mg Tabs (Azithromycin) .Marland Kitchen... As directed  Problem # 2:  ADVERSE DRUG REACTION (ICD-995.20)  Problem # 3:  ESSENTIAL HYPERTENSION (ICD-401.9)  The following medications were removed from the medication list:    Benazepril-hydrochlorothiazide 20-12.5 Mg Tabs (Benazepril-hydrochlorothiazide) .Marland Kitchen... 1 by mouth once daily Her updated  medication list  for this problem includes:    Benazepril-hydrochlorothiazide 20-25 Mg Tabs (Benazepril-hydrochlorothiazide) ..... Once daily  Complete Medication List: 1)  Lipitor 20 Mg Tabs (Atorvastatin calcium) .... Once daily 2)  Aspir-low 81 Mg Tbec (Aspirin) .Marland Kitchen.. 1 by mouth once daily 3)  Actonel 150 Mg Tabs (Risedronate sodium) .Marland Kitchen.. 1 a month 4)  Singulair 10 Mg Tabs (Montelukast sodium) .Marland Kitchen.. 1 by mouth daily 5)  Proair Hfa 108 (90 Base) Mcg/act Aers (Albuterol sulfate) .... 2 puffs as needed 6)  Calcium Carbonate-vitamin D 600-400 Mg-unit Tabs (Calcium carbonate-vitamin d) .Marland Kitchen.. 1 by mouth once daily 7)  Combivent 103-18 Mcg/act Aero (Ipratropium-albuterol) .... Inhale 2 puffs three times a day as needed for sob 8)  Benazepril-hydrochlorothiazide 20-25 Mg Tabs (Benazepril-hydrochlorothiazide) .... Once daily 9)  Zithromax Z-pak 250 Mg Tabs (Azithromycin) .... As directed  Patient Instructions: 1)  Switch to a Zpack. Increase her BP med as above.  2)  Please schedule a follow-up appointment as needed .  Prescriptions: ZITHROMAX Z-PAK 250 MG TABS (AZITHROMYCIN) as directed  #1 x 0   Entered and Authorized by:   Nelwyn Salisbury MD   Signed by:   Nelwyn Salisbury MD on 08/29/2009   Method used:   [facility]        [facility]  [phone]* (retail)       [address] [place], [place]  [address]       Ph: [phone] or [phone]       Fax: [phone]   RxID:   [id number] BENAZEPRIL-HYDROCHLOROTHIAZIDE 20-25 MG TABS (BENAZEPRIL-HYDROCHLOROTHIAZIDE) once daily  #30 x 11   Entered and Authorized by:   Nelwyn Salisbury MD   Signed by:   Nelwyn Salisbury MD on 08/29/2009   Method used:   [facility]        [facility]  [phone]* (retail)       [address] [place], [place]  [address]       Ph: [phone] or [phone]       Fax: [phone]   RxID:   [id number]

## 2010-07-26 NOTE — Progress Notes (Signed)
Summary: ? re recall date   Phone Note Call from Patient Call back at Home Phone [phone]   Caller: Patient Call For: Dr Russella Dar Reason for Call: Talk to Nurse Summary of Call: Patient wants to verify with Dr Russella Dar if she's do for a recall in 3 years or 5 years. States that her paperworks state 3 years but our system has her for 5 years. Initial call taken by: Tawni Levy,  July 05, 2010 1:40 PM  Follow-up for Phone Call        paper chart ordered. Follow-up by: Darcey Nora RN, CGRN,  July 05, 2010 2:47 PM     Appended Document: ? re recall date chart ordered for review  Appended Document: ? re recall date Dr Russella Dar according to her EMR she has a family hx of colon CA.  I have placed the chart in your office for your review.  Please advise colon recall  Appended Document: ? re recall date Given her strong history of colon cancer and history of adenomatous colon polyps we should make the recall for 06/2010, 4 years. Can schedule directly.  Appended Document: ? re recall date Left message for patient to call back    Appended Document: ? re recall date Patient advised of Dr Ardell Isaacs recommendations.  She is requesting propofol.  Is it ok to schedule here with propofol?  She said you mentioned to her in the past she may need deeper sedation.  Appended Document: ? re recall date OK for propofol. I may have slots open on my next propofol day.  Appended Document: ? re recall date recall scheduled for 08/01/10 9:00, pre-visit scheduled for 07/24/10 9:00

## 2010-08-01 ENCOUNTER — Encounter (AMBULATORY_SURGERY_CENTER): Payer: 59 | Admitting: Gastroenterology

## 2010-08-01 ENCOUNTER — Other Ambulatory Visit: Payer: Self-pay | Admitting: Gastroenterology

## 2010-08-01 DIAGNOSIS — Z1211 Encounter for screening for malignant neoplasm of colon: Secondary | ICD-10-CM

## 2010-08-01 DIAGNOSIS — Z800 Family history of malignant neoplasm of digestive organs: Secondary | ICD-10-CM

## 2010-08-01 DIAGNOSIS — D126 Benign neoplasm of colon, unspecified: Secondary | ICD-10-CM

## 2010-08-01 DIAGNOSIS — Z86010 Personal history of colonic polyps: Secondary | ICD-10-CM

## 2010-08-01 LAB — HM COLONOSCOPY

## 2010-08-01 NOTE — Assessment & Plan Note (Signed)
Summary: cough/asthma/cjr   Vital Signs:  Patient profile:   65 year old female Weight:      198 pounds O2 Sat:      97 % Temp:     98.3 degrees F Pulse rate:   82 / minute BP sitting:   130 / 80  (left arm) Cuff size:   large  Vitals Entered By: Pura Spice, RN (July 24, 2010 2:38 PM) CC: wants chest listened to - colonscopy sch 08-01-10  some cough  and wants something  beside lipitor    History of Present Illness: Here to check her asthma and to discuss her Lipitor. She has had some mildly increased wheezing over the winter but she has not felt sick. No fever or cough. Also she was recently switched from Lipitor to generic atorvastatin, and she thinks she is not tolerating this well. It makes her groggy na dshe feels like her mind does not focus as it should. She wants to go back to brand name Lipitor.   Allergies: 1)  ! Bactrim (Sulfamethoxazole-Trimethoprim) 2)  ! Penicillin V Potassium (Penicillin V Potassium) 3)  ! Cefdinir (Cefdinir) 4)  ! * Ivp Dye 5)  ! * Sulfur  Past History:  Past Medical History: Reviewed history from 12/29/2007 and no changes required. Anemia-NOS Osteopenia Asthma Sleep apnea Diverticulosis, colon Headache Nephrolithiasis, hx of Osteoarthritis psoriasis  Review of Systems  The patient denies anorexia, fever, weight loss, weight gain, vision loss, decreased hearing, hoarseness, chest pain, syncope, dyspnea on exertion, peripheral edema, prolonged cough, headaches, hemoptysis, abdominal pain, melena, hematochezia, severe indigestion/heartburn, hematuria, incontinence, genital sores, muscle weakness, suspicious skin lesions, transient blindness, difficulty walking, depression, unusual weight change, abnormal bleeding, enlarged lymph nodes, angioedema, breast masses, and testicular masses.    Physical Exam  General:  overweight-appearing.   Neck:  No deformities, masses, or tenderness noted. Lungs:  Normal respiratory effort, chest  expands symmetrically. Lungs are clear to auscultation, no crackles or wheezes. Heart:  Normal rate and regular rhythm. S1 and S2 normal without gallop, murmur, click, rub or other extra sounds.   Impression & Recommendations:  Problem # 1:  HYPERLIPIDEMIA (ICD-272.4)  Her updated medication list for this problem includes:    Lipitor 20 Mg Tabs (Atorvastatin calcium) ..... Once daily  Problem # 2:  ASTHMA (ICD-493.90)  Her updated medication list for this problem includes:    Singulair 10 Mg Tabs (Montelukast sodium) .Marland Kitchen... 1 by mouth daily    Proair Hfa 108 (90 Base) Mcg/act Aers (Albuterol sulfate) .Marland Kitchen... 2 puffs as needed    Combivent 103-18 Mcg/act Aero (Ipratropium-albuterol) ..... Inhale 2 puffs three times a day as needed for sob  Complete Medication List: 1)  Lipitor 20 Mg Tabs (Atorvastatin calcium) .... Once daily 2)  Aspir-low 81 Mg Tbec (Aspirin) .Marland Kitchen.. 1 by mouth once daily 3)  Actonel 150 Mg Tabs (Risedronate sodium) .Marland Kitchen.. 1 a month 4)  Singulair 10 Mg Tabs (Montelukast sodium) .Marland Kitchen.. 1 by mouth daily 5)  Proair Hfa 108 (90 Base) Mcg/act Aers (Albuterol sulfate) .... 2 puffs as needed 6)  Calcium Carbonate-vitamin D 600-400 Mg-unit Tabs (Calcium carbonate-vitamin d) .Marland Kitchen.. 1 by mouth once daily 7)  Combivent 103-18 Mcg/act Aero (Ipratropium-albuterol) .... Inhale 2 puffs three times a day as needed for sob 8)  Benazepril-hydrochlorothiazide 20-25 Mg Tabs (Benazepril-hydrochlorothiazide) .... Once daily 9)  Epipen 0.3 Mg/0.57ml Devi (Epinephrine) .... Uad 10)  Moviprep 100 Gm Solr (Peg-kcl-nacl-nasulf-na asc-c) .... As per prep instructions.  Patient Instructions: 1)  Her asthma seems to be stable. We will write for her to receive name brand Lipitor only.  Prescriptions: LIPITOR 20 MG TABS (ATORVASTATIN CALCIUM) once daily Brand medically necessary #30 x 11   Entered and Authorized by:   Nelwyn Salisbury MD   Signed by:   Nelwyn Salisbury MD on 07/24/2010   Method used:   Print then  Give to Patient   RxID:   [id number]    Orders Added: 1)  Est. Patient Level IV [[address]782]

## 2010-08-01 NOTE — Progress Notes (Signed)
Summary: Update   Phone Note Call from Patient Call back at Home Phone [phone]   Caller: Patient Call For: Dr. Russella Dar Reason for Call: Talk to Nurse Summary of Call: Pt has an epipen, also she went to see Abran Cantor and she can have procedure as long as she does breathing treatment one hour before procedure Initial call taken by: [place] Johnson,  July 25, 2010 11:14 AM  Follow-up for Phone Call        tried to call pt, now answer.  Will try later.  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  She also wanted [place] to know that she saw Dr. Abran Cantor yesterday.  He said it was okay to have colonoscopy.  Pt will do breathing treatment before coming in for procedure. Follow-up by: Ezra Sites RN,  July 25, 2010 12:27 PM

## 2010-08-01 NOTE — Letter (Signed)
Summary: [facility] Instructions  [facility]  [address] [place], [place] [address]   Phone: [phone]  Fax: [phone]       Angela Sheppard    02-Sep-1945    MRN: [id number]        Procedure Day Dorna Bloom:  Wednesday 08/01/2010     Arrival Time:  8:00 am      Procedure Time:  9:00 am     Location of Procedure:                    _x _  [facility] (4th Floor)                        PREPARATION FOR COLONOSCOPY WITH MOVIPREP   Starting 5 days prior to your procedure Friday 2/3 do not eat nuts, seeds, popcorn, corn, beans, peas,  salads, or any raw vegetables.  Do not take any fiber supplements (e.g. Metamucil, Citrucel, and Benefiber).  THE DAY BEFORE YOUR PROCEDURE         DATE: Tuesday 2/7  1.  Drink clear liquids the entire day-NO SOLID FOOD  2.  Do not drink anything colored red or purple.  Avoid juices with pulp.  No orange juice.  3.  Drink at least 64 oz. (8 glasses) of fluid/clear liquids during the day to prevent dehydration and help the prep work efficiently.  CLEAR LIQUIDS INCLUDE: Water Jello Ice Popsicles Tea (sugar ok, no milk/cream) Powdered fruit flavored drinks Coffee (sugar ok, no milk/cream) Gatorade Juice: apple, white grape, white cranberry  Lemonade Clear bullion, consomm, broth Carbonated beverages (any kind) Strained chicken noodle soup Hard Candy                             4.  In the morning, mix first dose of MoviPrep solution:    Empty 1 Pouch A and 1 Pouch B into the disposable container    Add lukewarm drinking water to the top line of the container. Mix to dissolve    Refrigerate (mixed solution should be used within 24 hrs)  5.  Begin drinking the prep at 5:00 p.m. The MoviPrep container is divided by 4 marks.   Every 15 minutes drink the solution down to the next mark (approximately 8 oz) until the full liter is complete.   6.  Follow completed prep with 16 oz of clear liquid of your choice (Nothing  red or purple).  Continue to drink clear liquids until bedtime.  7.  Before going to bed, mix second dose of MoviPrep solution:    Empty 1 Pouch A and 1 Pouch B into the disposable container    Add lukewarm drinking water to the top line of the container. Mix to dissolve    Refrigerate  THE DAY OF YOUR PROCEDURE      DATE: Wednesday 2/8  Beginning at 4:00 a.m. (5 hours before procedure):         1. Every 15 minutes, drink the solution down to the next mark (approx 8 oz) until the full liter is complete.  2. Follow completed prep with 16 oz. of clear liquid of your choice.    3. You may drink clear liquids until 7:00 am (2 HOURS BEFORE PROCEDURE).   MEDICATION INSTRUCTIONS  Unless otherwise instructed, you should take regular prescription medications with a small sip of water   as early as possible the  morning of your procedure.  Additional medication instructions: Do not take  Benazepril/HCTZ am of priocedure         OTHER INSTRUCTIONS  You will need a responsible adult at least 63 years of age to accompany you and drive you home.   This person must remain in the waiting room during your procedure.  Wear loose fitting clothing that is easily removed.  Leave jewelry and other valuables at home.  However, you may wish to bring a book to read or  an iPod/MP3 player to listen to music as you wait for your procedure to start.  Remove all body piercing jewelry and leave at home.  Total time from sign-in until discharge is approximately 2-3 hours.  You should go home directly after your procedure and rest.  You can resume normal activities the  day after your procedure.  The day of your procedure you should not:   Drive   Make legal decisions   Operate machinery   Drink alcohol   Return to work  You will receive specific instructions about eating, activities and medications before you leave.    The above instructions have been reviewed and explained to me by    Ulis Rias RN  July 24, 2010 9:55 AM     I fully understand and can verbalize these instructions _____________________________ Date _________

## 2010-08-01 NOTE — Miscellaneous (Signed)
Summary: Lec previsit  Clinical Lists Changes  Medications: Added new medication of MOVIPREP 100 GM  SOLR (PEG-KCL-NACL-NASULF-NA ASC-C) As per prep instructions. - Signed Rx of MOVIPREP 100 GM  SOLR (PEG-KCL-NACL-NASULF-NA ASC-C) As per prep instructions.;  #1 x 0;  Signed;  Entered by: Ulis Rias RN;  Authorized by: Meryl Dare MD [facility];  Method used: [facility]  [phone]*, [address], [place], [place]  [address], Ph: [phone] or [phone], Fax: [phone] Allergies: Added new allergy or adverse reaction of * IVP DYE Added new allergy or adverse reaction of * SULFUR    Prescriptions: MOVIPREP 100 GM  SOLR (PEG-KCL-NACL-NASULF-NA ASC-C) As per prep instructions.  #1 x 0   Entered by:   Ulis Rias RN   Authorized by:   Meryl Dare MD [facility]   Signed by:   Ulis Rias RN on 07/24/2010   Method used:   [facility] to        [facility]  [phone]* (retail)       [address] [place], [place]  [address]       Ph: [phone] or [phone]       Fax: [phone]   RxID:   [id number]   Appended Document: Lec previsit Pt wanted to add new allergies to her medication list." Demerol" causes hallucinations and Medrol makes her "pass out".

## 2010-08-06 ENCOUNTER — Encounter: Payer: Self-pay | Admitting: Gastroenterology

## 2010-08-15 NOTE — Procedures (Signed)
Summary: Colonoscopy   Colonoscopy  Procedure date:  08/01/2010  Findings:      Location:  [facility].   COLONOSCOPY PROCEDURE REPORT PATIENT:  Angela Sheppard, Angela Sheppard  MR#:  [id number] BIRTHDATE:   1946/03/05, 65 yrs. old   GENDER:   female ENDOSCOPIST:   Judie Petit T. Russella Dar, MD, [facility]   PROCEDURE DATE:  08/01/2010 PROCEDURE:  Colonoscopy with snare polypectomy ASA CLASS:   Class II INDICATIONS: 1) surveillance and high-risk screening  2) history of pre-cancerous (adenomatous) colon polyps, 06/2006  3) family history of colon cancer:  brother age 18 and 2 maternal cousins. MEDICATIONS:    propofol (Diprivan) 200 mg IV DESCRIPTION OF PROCEDURE:   After the risks benefits and alternatives of the procedure were thoroughly explained, informed consent was obtained.  Digital rectal exam was performed and revealed no abnormalities.   The LB PCF-H180AL [id number] endoscope was introduced through the anus and advanced to the cecum, which was identified by both the appendix and ileocecal valve, without limitations.  The quality of the prep was excellent, using MoviPrep.  The instrument was then slowly withdrawn as the colon was fully examined. <<PROCEDUREIMAGES>>          <<OLD IMAGES>> FINDINGS:  A sessile polyp was found in the mid transverse colon. It was 5 mm in size. Polyp was snared without cautery. Retrieval was successful.  A sessile polyp was found in the sigmoid colon. It was 4 mm in size. Polyp was snared without cautery. Retrieval was successful. Mild diverticulosis was found in the sigmoid colon. Otherwise normal colonoscopy without polyps, masses, vascular ectasias, or inflammatory changes. Retroflexed views in the rectum revealed no abnormalities.  The time to cecum =  2.33  minutes. The scope was then withdrawn (time =  14  min) from the patient and the procedure completed. COMPLICATIONS:   None  ENDOSCOPIC IMPRESSION:  1) 5 mm sessile polyp in the mid transverse colon  2) 4 mm  sessile polyp in the sigmoid colon  3) Mild diverticulosis in the sigmoid colon RECOMMENDATIONS:  1) Await pathology results  2) High fiber diet with liberal fluid intake.  3) Repeat Colonoscopy in 3 years.  Venita Lick. Russella Dar, MD, [facility]   CC: Nelwyn Salisbury, MD    Appended Document: Colonoscopy     Procedures Next Due Date:    Colonoscopy: 07/2013

## 2010-08-15 NOTE — Letter (Signed)
Summary: Patient Notice- Polyp Results  [facility]  [address] [place], [place] [address]   Phone: [phone]  Fax: [phone]        August 06, 2010 MRN: [id number]    ROSITA GUZZETTA [address] [place], [place]  [address]    Dear Ms. Weisberg,  I am pleased to inform you that the colon polyp(s) removed during your recent colonoscopy was (were) found to be benign (no cancer detected) upon pathologic examination.  I recommend you have a repeat colonoscopy examination in 3 years to look for recurrent polyps, as having colon polyps increases your risk for having recurrent polyps or even colon cancer in the future.  Should you develop new or worsening symptoms of abdominal pain, bowel habit changes or bleeding from the rectum or bowels, please schedule an evaluation with either your primary care physician or with me.  Continue treatment plan as outlined the day of your exam.  Please call us if you are having persistent problems or have questions about your condition that have not been fully answered at this time.  Sincerely,  Meryl Dare MD [facility]  This letter has been electronically signed by your physician.  Appended Document: Patient Notice- Polyp Results Letter Mailed

## 2010-08-21 ENCOUNTER — Other Ambulatory Visit: Payer: Self-pay | Admitting: Family Medicine

## 2010-08-28 ENCOUNTER — Telehealth: Payer: Self-pay | Admitting: Family Medicine

## 2010-08-28 NOTE — Telephone Encounter (Signed)
Pt called and said that she has been exposed to someone that has pink eye. Pt now is showing symptoms. Eye lid swollen and turned and is deep pink, eye is pinkish in color. Pt is req an eye drop or med for pink eye. [facility] [address]. Call pt is questions or if she needs ov.

## 2010-08-29 NOTE — Telephone Encounter (Signed)
She needs an OV for this  

## 2010-08-29 NOTE — Telephone Encounter (Signed)
patient  Is aware 

## 2011-03-19 ENCOUNTER — Other Ambulatory Visit: Payer: Self-pay | Admitting: Obstetrics and Gynecology

## 2011-03-19 DIAGNOSIS — Z1231 Encounter for screening mammogram for malignant neoplasm of breast: Secondary | ICD-10-CM

## 2011-04-03 ENCOUNTER — Other Ambulatory Visit: Payer: Self-pay | Admitting: Family Medicine

## 2011-04-04 ENCOUNTER — Ambulatory Visit
Admission: RE | Admit: 2011-04-04 | Discharge: 2011-04-04 | Disposition: A | Discharge status: Home / Self Care | Payer: 59 | Source: Ambulatory Visit | Attending: Obstetrics and Gynecology | Admitting: Obstetrics and Gynecology

## 2011-04-04 DIAGNOSIS — Z1231 Encounter for screening mammogram for malignant neoplasm of breast: Secondary | ICD-10-CM

## 2011-04-19 ENCOUNTER — Other Ambulatory Visit: Payer: Self-pay | Admitting: Family Medicine

## 2011-04-22 ENCOUNTER — Encounter: Payer: Self-pay | Admitting: Family Medicine

## 2011-04-22 ENCOUNTER — Ambulatory Visit (INDEPENDENT_AMBULATORY_CARE_PROVIDER_SITE_OTHER): Payer: 59 | Admitting: Family Medicine

## 2011-04-22 VITALS — BP 138/90 | HR 77 | Temp 98.5°F | Wt 200.0 lb

## 2011-04-22 DIAGNOSIS — I10 Essential (primary) hypertension: Secondary | ICD-10-CM

## 2011-04-22 DIAGNOSIS — T783XXA Angioneurotic edema, initial encounter: Secondary | ICD-10-CM

## 2011-04-22 MED ORDER — LOSARTAN POTASSIUM-HCTZ 100-25 MG PO TABS: 1.0000 | ORAL_TABLET | Freq: Every day | ORAL | Status: DC

## 2011-04-22 MED ORDER — METHYLPREDNISOLONE ACETATE 80 MG/ML IJ SUSP
120.0000 mg | Freq: Once | INTRAMUSCULAR | Status: AC
  Administered 2011-04-22: 120 mg via INTRAMUSCULAR

## 2011-04-22 NOTE — Progress Notes (Signed)
Addended by: Aniceto Boss A on: 04/22/2011 12:26 PM   Modules accepted: Orders

## 2011-04-22 NOTE — Progress Notes (Signed)
  Subjective:    Patient ID: Angela Sheppard, female    DOB: 22-Aug-1945, 65 y.o.   MRN: [id number]  HPI Here for 2 days of swelling around the eyes, the mouth, and the neck. No SOB or wheezing. No recent medication changes but she has been on Benazepril HCT for about one year.    Review of Systems  Constitutional: Negative.   HENT: Positive for facial swelling.   Respiratory: Negative.   Cardiovascular: Negative.        Objective:   Physical Exam  Constitutional: She appears well-developed and well-nourished.  HENT:       Edema around the eyes and the lips. The tongue is normal. Speech is normal.   Eyes: Conjunctivae and EOM are normal. Pupils are equal, round, and reactive to light.  Neck: Neck supple. No thyromegaly present.  Cardiovascular: Normal rate, regular rhythm, normal heart sounds and intact distal pulses.   Pulmonary/Chest: Effort normal and breath sounds normal. No respiratory distress. She has no wheezes. She has no rales.  Lymphadenopathy:    She has no cervical adenopathy.          Assessment & Plan:  This could be a reaction to her ACE inhibitor. Stop Benazepril HCT and switch to Losartan HCT. Recheck in one month

## 2011-04-23 NOTE — Telephone Encounter (Signed)
Script sent e-scribe 

## 2011-04-25 ENCOUNTER — Telehealth: Payer: Self-pay | Admitting: *Deleted

## 2011-04-25 NOTE — Telephone Encounter (Signed)
The one thing both of these meds have in common is the HCTZ. Stop Losartan HCT and switch to just Losartan 100 mg a day. Call in a 6 month supply. If she is allergic to the HCTZ, the hives should resolve

## 2011-04-25 NOTE — Telephone Encounter (Signed)
Since going off the Benazepril Hctz, her oral mucosa has healed and looks fine, but she still has hives everytime she takes  Hyzaar 100/25.  Asking for advice.

## 2011-04-25 NOTE — Telephone Encounter (Signed)
Left voice message with info and asked pt to return my call.

## 2011-04-26 MED ORDER — LOSARTAN POTASSIUM 100 MG PO TABS: 100.0000 mg | ORAL_TABLET | Freq: Every day | ORAL | Status: DC

## 2011-04-26 NOTE — Telephone Encounter (Signed)
I spoke with pt and sent new script e-scribe. 

## 2011-04-30 ENCOUNTER — Other Ambulatory Visit: Payer: Self-pay | Admitting: Family Medicine

## 2011-05-03 ENCOUNTER — Encounter: Payer: Self-pay | Admitting: Family Medicine

## 2011-05-03 ENCOUNTER — Ambulatory Visit (INDEPENDENT_AMBULATORY_CARE_PROVIDER_SITE_OTHER): Payer: 59 | Admitting: Family Medicine

## 2011-05-03 VITALS — BP 122/80 | HR 96 | Temp 98.6°F | Wt 192.0 lb

## 2011-05-03 DIAGNOSIS — J069 Acute upper respiratory infection, unspecified: Secondary | ICD-10-CM

## 2011-05-03 DIAGNOSIS — L309 Dermatitis, unspecified: Secondary | ICD-10-CM

## 2011-05-03 DIAGNOSIS — L259 Unspecified contact dermatitis, unspecified cause: Secondary | ICD-10-CM

## 2011-05-03 MED ORDER — ALBUTEROL SULFATE (2.5 MG/3ML) 0.083% IN NEBU: 2.5000 mg | INHALATION_SOLUTION | RESPIRATORY_TRACT | Status: AC | PRN

## 2011-05-03 MED ORDER — HALOBETASOL PROPIONATE 0.05 % EX CREA: TOPICAL_CREAM | Freq: Two times a day (BID) | CUTANEOUS | Status: AC

## 2011-05-03 NOTE — Progress Notes (Signed)
  Subjective:    Patient ID: Angela Sheppard, female    DOB: 1946-01-06, 65 y.o.   MRN: [id number]  HPI Here for 5 days of coughing up sputum and low grade fevers. She is feeling much better now though with no fever, and the sputum has turned to clear. Also she still has itchy rashes on her arms.    Review of Systems  Constitutional: Negative.   HENT: Positive for congestion, postnasal drip and sinus pressure.   Eyes: Negative.   Respiratory: Positive for cough.   Skin: Positive for rash.       Objective:   Physical Exam  Constitutional: She appears well-developed and well-nourished.  HENT:  Right Ear: External ear normal.  Left Ear: External ear normal.  Nose: Nose normal.  Mouth/Throat: Oropharynx is clear and moist. No oropharyngeal exudate.  Eyes: Conjunctivae are normal. Pupils are equal, round, and reactive to light.  Neck: No thyromegaly present.  Pulmonary/Chest: Effort normal and breath sounds normal. No respiratory distress. She has no wheezes. She has no rales. She exhibits no tenderness.  Lymphadenopathy:    She has no cervical adenopathy.  Skin:       Red scaly patches on both forearms          Assessment & Plan:  The viral URI is runing its course, and it should be gone on a few more days. Try Halobetasol cream on the arms

## 2011-06-05 ENCOUNTER — Other Ambulatory Visit (INDEPENDENT_AMBULATORY_CARE_PROVIDER_SITE_OTHER): Payer: 59

## 2011-06-05 DIAGNOSIS — Z0000 Encounter for general adult medical examination without abnormal findings: Secondary | ICD-10-CM

## 2011-06-05 LAB — CBC WITH DIFFERENTIAL/PLATELET
Eosinophils Relative: 2.8 % (ref 0.0–5.0)
HCT: 38.8 % (ref 36.0–46.0)
Hemoglobin: 13.3 g/dL (ref 12.0–15.0)
Lymphocytes Relative: 32.9 % (ref 12.0–46.0)
Lymphs Abs: 2.1 10*3/uL (ref 0.7–4.0)
Monocytes Relative: 6.8 % (ref 3.0–12.0)
Neutro Abs: 3.6 10*3/uL (ref 1.4–7.7)
WBC: 6.3 10*3/uL (ref 4.5–10.5)

## 2011-06-05 LAB — POCT URINALYSIS DIPSTICK
Ketones, UA: NEGATIVE
Nitrite, UA: NEGATIVE
Protein, UA: NEGATIVE
pH, UA: 7.5

## 2011-06-05 LAB — HEPATIC FUNCTION PANEL
ALT: 17 U/L (ref 0–35)
AST: 23 U/L (ref 0–37)
Albumin: 4.2 g/dL (ref 3.5–5.2)
Alkaline Phosphatase: 64 U/L (ref 39–117)
Total Bilirubin: 0.6 mg/dL (ref 0.3–1.2)

## 2011-06-05 LAB — LIPID PANEL
Total CHOL/HDL Ratio: 3
VLDL: 22 mg/dL (ref 0.0–40.0)

## 2011-06-05 LAB — BASIC METABOLIC PANEL
CO2: 26 mEq/L (ref 19–32)
Calcium: 9 mg/dL (ref 8.4–10.5)
Creatinine, Ser: 0.9 mg/dL (ref 0.4–1.2)

## 2011-06-05 LAB — TSH: TSH: 4.38 u[IU]/mL (ref 0.35–5.50)

## 2011-06-10 ENCOUNTER — Telehealth: Payer: Self-pay | Admitting: Family Medicine

## 2011-06-10 ENCOUNTER — Encounter: Payer: Self-pay | Admitting: Family Medicine

## 2011-06-10 NOTE — Telephone Encounter (Signed)
Request for Montelukast and send a 90 day order to [facility], also [facility]

## 2011-06-10 NOTE — Progress Notes (Signed)
Quick Note:  Spoke with pt and put a copy in mail. ______ 

## 2011-06-11 ENCOUNTER — Encounter: Payer: 59 | Admitting: Family Medicine

## 2011-06-11 MED ORDER — MONTELUKAST SODIUM 10 MG PO TABS: 10.0000 mg | ORAL_TABLET | Freq: Every day | ORAL | Status: DC

## 2011-06-11 NOTE — Telephone Encounter (Signed)
Script sent e-scribe and cancelled local refills.

## 2011-07-24 ENCOUNTER — Encounter: Payer: 59 | Admitting: Family Medicine

## 2011-08-28 ENCOUNTER — Encounter: Payer: 59 | Admitting: Family Medicine

## 2011-08-30 ENCOUNTER — Other Ambulatory Visit: Payer: Self-pay | Admitting: Family Medicine

## 2011-10-08 ENCOUNTER — Encounter: Payer: 59 | Admitting: Family Medicine

## 2011-10-25 ENCOUNTER — Other Ambulatory Visit: Payer: Self-pay | Admitting: Family Medicine

## 2011-11-07 ENCOUNTER — Other Ambulatory Visit: Payer: Self-pay | Admitting: Family Medicine

## 2011-11-07 NOTE — Telephone Encounter (Signed)
Can we refill this? 

## 2011-11-07 NOTE — Telephone Encounter (Signed)
This is 2 puffs q 4 hours prn SOB, call in #1 with 11 rf

## 2011-12-20 ENCOUNTER — Telehealth: Payer: Self-pay | Admitting: Family Medicine

## 2011-12-20 ENCOUNTER — Other Ambulatory Visit: Payer: Self-pay | Admitting: Family Medicine

## 2011-12-20 MED ORDER — IPRATROPIUM-ALBUTEROL 20-100 MCG/ACT IN AERS: 1.0000 | INHALATION_SPRAY | Freq: Four times a day (QID) | RESPIRATORY_TRACT | Status: DC

## 2011-12-20 NOTE — Telephone Encounter (Signed)
Okay per Dr. Clent Ridges to change to Respima Combivent and I did send e-scribe.

## 2011-12-20 NOTE — Telephone Encounter (Signed)
Pharmacy sent over a fax and the regular Combivent inhaler is no longer available. Can you change to Respimat 1 puff qid?

## 2011-12-25 ENCOUNTER — Other Ambulatory Visit: Payer: Self-pay | Admitting: Urology

## 2011-12-25 DIAGNOSIS — N289 Disorder of kidney and ureter, unspecified: Secondary | ICD-10-CM

## 2012-01-07 ENCOUNTER — Ambulatory Visit
Admission: RE | Admit: 2012-01-07 | Discharge: 2012-01-07 | Disposition: A | Discharge status: Home / Self Care | Payer: 59 | Source: Ambulatory Visit | Attending: Urology | Admitting: Urology

## 2012-01-07 DIAGNOSIS — N289 Disorder of kidney and ureter, unspecified: Secondary | ICD-10-CM

## 2012-01-07 MED ORDER — GADOBENATE DIMEGLUMINE 529 MG/ML IV SOLN
18.0000 mL | Freq: Once | INTRAVENOUS | Status: AC | PRN
  Administered 2012-01-07: 18 mL via INTRAVENOUS

## 2012-02-06 ENCOUNTER — Other Ambulatory Visit: Payer: Self-pay | Admitting: Family Medicine

## 2012-02-25 ENCOUNTER — Other Ambulatory Visit: Payer: Self-pay | Admitting: Obstetrics and Gynecology

## 2012-02-26 ENCOUNTER — Other Ambulatory Visit: Payer: Self-pay | Admitting: Obstetrics and Gynecology

## 2012-02-26 DIAGNOSIS — Z1231 Encounter for screening mammogram for malignant neoplasm of breast: Secondary | ICD-10-CM

## 2012-04-07 ENCOUNTER — Ambulatory Visit
Admission: RE | Admit: 2012-04-07 | Discharge: 2012-04-07 | Disposition: A | Discharge status: Home / Self Care | Payer: 59 | Source: Ambulatory Visit | Attending: Obstetrics and Gynecology | Admitting: Obstetrics and Gynecology

## 2012-04-07 DIAGNOSIS — Z1231 Encounter for screening mammogram for malignant neoplasm of breast: Secondary | ICD-10-CM

## 2012-05-04 ENCOUNTER — Other Ambulatory Visit: Payer: Self-pay | Admitting: Family Medicine

## 2012-06-03 ENCOUNTER — Ambulatory Visit (INDEPENDENT_AMBULATORY_CARE_PROVIDER_SITE_OTHER): Payer: 59 | Admitting: Surgery

## 2012-06-03 ENCOUNTER — Encounter (INDEPENDENT_AMBULATORY_CARE_PROVIDER_SITE_OTHER): Payer: Self-pay | Admitting: Surgery

## 2012-06-03 ENCOUNTER — Other Ambulatory Visit: Payer: Self-pay | Admitting: Family Medicine

## 2012-06-03 VITALS — BP 143/78 | HR 77 | Temp 97.8°F | Resp 16 | Ht 65.0 in | Wt 204.0 lb

## 2012-06-03 DIAGNOSIS — N6089 Other benign mammary dysplasias of unspecified breast: Secondary | ICD-10-CM

## 2012-06-03 NOTE — Progress Notes (Signed)
Subjective:     Patient ID: Angela Sheppard, female   DOB: February 14, 1946, 66 y.o.   MRN: [id number]  HPI She is a self-referral for a small superficial cyst in the skin of her right breast. It has been there for several months but is now getting smaller. She denies any drainage or erythema. She has a previous history of atypical cells in the left breast on biopsy but no history of cancer. She has no other complaints. She denies nipple discharge.  Review of Systems     Objective:   Physical Exam On exam, lungs are clear with regular respiratory rhythm. Heart is regular rate and rhythm with no murmurs. There is no axillary adenopathy on the right side. There is a superficial 5 mm cyst underneath the skin at the 12:00 position of the right breast.    Assessment:     Sebaceous cyst of the breast    Plan:     I discussed this with her in detail. I believe this can be removed in the office which is her request. We will schedule this for the procedure room at her convenience

## 2012-08-18 ENCOUNTER — Telehealth (INDEPENDENT_AMBULATORY_CARE_PROVIDER_SITE_OTHER): Payer: Self-pay | Admitting: General Surgery

## 2012-08-18 NOTE — Telephone Encounter (Signed)
Message copied by Wilder Glade on Tue Aug 18, 2012 10:33 AM ------      Message from: Marchia Bond      Created: Tue Aug 18, 2012 10:12 AM      Regarding: needs appt      Contact: [phone]       Dr Magnus Ivan pt was told to call in to make an appt to have her mass taken out in office call her back at [phone] ------

## 2012-08-18 NOTE — Telephone Encounter (Signed)
Pt returned call to [facility], but she was with a pt.  Made appt with Dr. Magnus Ivan to reassess the mass.

## 2012-08-18 NOTE — Telephone Encounter (Signed)
LMOM for patient call back and ask for Pattricia Boss to schedule a apt to come in see Dr Magnus Ivan

## 2012-08-24 ENCOUNTER — Ambulatory Visit (INDEPENDENT_AMBULATORY_CARE_PROVIDER_SITE_OTHER): Payer: 59 | Admitting: Surgery

## 2012-09-03 ENCOUNTER — Other Ambulatory Visit (INDEPENDENT_AMBULATORY_CARE_PROVIDER_SITE_OTHER): Payer: Self-pay | Admitting: Surgery

## 2012-09-03 ENCOUNTER — Encounter (INDEPENDENT_AMBULATORY_CARE_PROVIDER_SITE_OTHER): Payer: Self-pay | Admitting: Surgery

## 2012-09-03 ENCOUNTER — Ambulatory Visit (INDEPENDENT_AMBULATORY_CARE_PROVIDER_SITE_OTHER): Payer: 59 | Admitting: Surgery

## 2012-09-03 VITALS — BP 144/86 | HR 84 | Temp 97.9°F | Resp 16 | Ht 65.0 in | Wt 205.0 lb

## 2012-09-03 DIAGNOSIS — N63 Unspecified lump in unspecified breast: Secondary | ICD-10-CM

## 2012-09-03 DIAGNOSIS — L723 Sebaceous cyst: Secondary | ICD-10-CM

## 2012-09-03 NOTE — Progress Notes (Signed)
Subjective:     Patient ID: Angela Sheppard, female   DOB: 05/12/1946, 66 y.o.   MRN: [id number]  HPI  She is here for a followup visit of the cyst on her right breast. Again, her mammograms October were unremarkable. She has had no complaints. Review of Systems     Objective:   Physical Exam On exam, there is a 5 mm cyst at approximately  The 11 to 12:00 position of the right breast. It is quite superficial. I cleaned the area Betadine, anesthetized the skin with lidocaine, and an elliptical incision with a scalpel removing the lesion. I then closed the incision with interrupted 3-0 Vicryl sutures and a running 4-0 Monocryl suture. Steri-Strips and a Band-Aid were then applied. The lesion was sent to pathology for evaluation    Assessment:     Right breast skin lesion     Plan:     Wound care instructions were given. I will call her with the pathology results. I will see her in October for yearly breast exam following her mammograms

## 2012-09-23 ENCOUNTER — Telehealth (INDEPENDENT_AMBULATORY_CARE_PROVIDER_SITE_OTHER): Payer: Self-pay | Admitting: General Surgery

## 2012-09-23 NOTE — Telephone Encounter (Signed)
Message copied by Wilder Glade on Wed Sep 23, 2012  1:45 PM ------      Message from: Marnette Burgess      Created: Wed Sep 23, 2012 12:39 PM      Contact: [phone]       Calling for test results, please call. ------

## 2012-09-23 NOTE — Telephone Encounter (Signed)
Called patient back and let her know that the path was normal

## 2012-09-29 ENCOUNTER — Ambulatory Visit (INDEPENDENT_AMBULATORY_CARE_PROVIDER_SITE_OTHER): Payer: 59 | Admitting: Surgery

## 2012-09-29 ENCOUNTER — Encounter (INDEPENDENT_AMBULATORY_CARE_PROVIDER_SITE_OTHER): Payer: Self-pay | Admitting: Surgery

## 2012-09-29 ENCOUNTER — Telehealth (INDEPENDENT_AMBULATORY_CARE_PROVIDER_SITE_OTHER): Payer: Self-pay | Admitting: *Deleted

## 2012-09-29 ENCOUNTER — Telehealth (INDEPENDENT_AMBULATORY_CARE_PROVIDER_SITE_OTHER): Payer: Self-pay | Admitting: General Surgery

## 2012-09-29 VITALS — BP 132/80 | HR 80 | Temp 98.8°F | Resp 18 | Ht 65.0 in | Wt 200.0 lb

## 2012-09-29 DIAGNOSIS — Z9889 Other specified postprocedural states: Secondary | ICD-10-CM

## 2012-09-29 MED ORDER — DOXYCYCLINE HYCLATE 100 MG PO TABS: 100.0000 mg | ORAL_TABLET | Freq: Two times a day (BID) | ORAL | Status: DC

## 2012-09-29 NOTE — Telephone Encounter (Signed)
LMOM for patient to call back and ask for Gil Ingwersen 

## 2012-09-29 NOTE — Addendum Note (Signed)
Addended byLiliana Cline on: 09/29/2012 04:14 PM   Modules accepted: Orders

## 2012-09-29 NOTE — Patient Instructions (Addendum)
Start antibiotic today, continue to keep the area of the incision clean. See Dr Magnus Ivan in about two weeks

## 2012-09-29 NOTE — Telephone Encounter (Signed)
Patient called in concerned about how her incision site appears.  Patient states redness around area and the center of the wound is a concern for the patient.  Appt scheduled in urgent office for today.

## 2012-09-29 NOTE — Progress Notes (Signed)
NAME: Angela Sheppard                                            DOB: 1946-06-04 DATE: 09/29/2012                                                  MRN: [id number]  CC:  Chief Complaint  Patient presents with  . Post-op Problem    Not healing    HPI: This patient comes in for post op follow-up .Sheunderwent position of small sebaceous cyst of the right breast upper portion by Dr. Abigail Miyamoto on 09/02/12. She comes to the urgent office today because she is concerned that it is not healing properly. She's had some drainage and some erythema. However this actually got a little bit better and the redness seems to be smaller today. She had a systemic symptoms or signs. PE:  VITAL SIGNS: BP 132/80  Pulse 80  Temp(Src) 98.8 F (37.1 C)  Resp 18  Ht 5\' 5"  (1.651 m)  Wt 200 lb (90.719 kg)  BMI 33.28 kg/m2  General: The patient appears to be healthy, NAD The incision is noted to be slightly open with some drainage. The area of redness measures 2 cm in diameter. The ovarian measures 5 mm. There is no obvious abscess. I think this is just with skin is not healed and opened up and there still likely some residual subcuticular sutures present.  DATA REVIEWED: I looked over the notes from her prior visits  IMPRESSION: Poorly healing incision with slight disruption     PLAN: She'll continue just local wound care. I will put her on doxycycline 100 twice a day. We'll have her followup in 2 weeks just to assure there is no residual problem.

## 2012-10-15 ENCOUNTER — Encounter (INDEPENDENT_AMBULATORY_CARE_PROVIDER_SITE_OTHER): Payer: 59 | Admitting: Surgery

## 2012-11-04 ENCOUNTER — Other Ambulatory Visit: Payer: Self-pay | Admitting: Family Medicine

## 2012-11-06 NOTE — Telephone Encounter (Signed)
Can we refill this? 

## 2012-11-24 ENCOUNTER — Other Ambulatory Visit: Payer: Self-pay

## 2012-11-24 DIAGNOSIS — Z1231 Encounter for screening mammogram for malignant neoplasm of breast: Secondary | ICD-10-CM

## 2012-12-08 ENCOUNTER — Other Ambulatory Visit: Payer: Self-pay | Admitting: Urology

## 2012-12-18 ENCOUNTER — Encounter (HOSPITAL_COMMUNITY): Payer: Self-pay | Admitting: Pharmacy Technician

## 2012-12-23 ENCOUNTER — Encounter (HOSPITAL_COMMUNITY): Payer: Self-pay

## 2012-12-23 ENCOUNTER — Ambulatory Visit (HOSPITAL_COMMUNITY)
Admission: RE | Admit: 2012-12-23 | Discharge: 2012-12-23 | Disposition: A | Discharge status: Home / Self Care | Payer: Medicare Other | Source: Ambulatory Visit | Attending: Urology | Admitting: Urology

## 2012-12-23 ENCOUNTER — Encounter (HOSPITAL_COMMUNITY)
Admission: RE | Admit: 2012-12-23 | Discharge: 2012-12-23 | Disposition: A | Discharge status: Home / Self Care | Payer: Medicare Other | Source: Ambulatory Visit | Attending: Urology | Admitting: Urology

## 2012-12-23 DIAGNOSIS — R3129 Other microscopic hematuria: Secondary | ICD-10-CM | POA: Insufficient documentation

## 2012-12-23 DIAGNOSIS — Z8679 Personal history of other diseases of the circulatory system: Secondary | ICD-10-CM

## 2012-12-23 DIAGNOSIS — R21 Rash and other nonspecific skin eruption: Secondary | ICD-10-CM

## 2012-12-23 DIAGNOSIS — Z01818 Encounter for other preprocedural examination: Secondary | ICD-10-CM | POA: Insufficient documentation

## 2012-12-23 DIAGNOSIS — Z01810 Encounter for preprocedural cardiovascular examination: Secondary | ICD-10-CM | POA: Diagnosis not present

## 2012-12-23 HISTORY — DX: Personal history of other diseases of the circulatory system: Z86.79

## 2012-12-23 HISTORY — DX: Anxiety disorder, unspecified: F41.9

## 2012-12-23 HISTORY — DX: Essential (primary) hypertension: I10

## 2012-12-23 HISTORY — DX: Nausea with vomiting, unspecified: Z98.890

## 2012-12-23 HISTORY — DX: Rash and other nonspecific skin eruption: R21

## 2012-12-23 HISTORY — DX: Personal history of urinary calculi: Z87.442

## 2012-12-23 HISTORY — DX: Family history of other specified conditions: Z84.89

## 2012-12-23 HISTORY — DX: Other specified postprocedural states: R11.2

## 2012-12-23 HISTORY — DX: Other specified disorders of adrenal gland: E27.8

## 2012-12-23 LAB — BASIC METABOLIC PANEL
CO2: 30 mEq/L (ref 19–32)
Chloride: 92 mEq/L — ABNORMAL LOW (ref 96–112)
GFR calc non Af Amer: 87 mL/min — ABNORMAL LOW (ref >90)
Glucose, Bld: 102 mg/dL — ABNORMAL HIGH (ref 70–99)
Potassium: 3.6 mEq/L (ref 3.5–5.1)
Sodium: 131 mEq/L — ABNORMAL LOW (ref 135–145)

## 2012-12-23 LAB — CBC
Hemoglobin: 12.7 g/dL (ref 12.0–15.0)
MCH: 31.5 pg (ref 26.0–34.0)
RBC: 4.03 MIL/uL (ref 3.87–5.11)

## 2012-12-23 NOTE — Pre-Procedure Instructions (Signed)
12-23-12 EKG/CXR done today. Dr. Rica Mast given pt. Hx. Review with multi-allergy experiences( 1 episode 12-20-12),Asthma, sleep apnea-no cpap- will see AM of surgery preop.

## 2012-12-23 NOTE — Patient Instructions (Addendum)
20 TANISH SINKLER  12/23/2012   Your procedure is scheduled on:  7-7 -2014  Report to [facility] at   1130     AM.  Call this number if you have problems the morning of surgery: [phone]  Or Presurgical Testing [phone](Orlie Cundari)      Do not eat food:After Midnight.  May have clear liquids:up to 6 Hours before arrival. Nothing after : 0700 AM, then nothing.  Clear liquids include soda, tea, black coffee, apple or grape juice, broth.  Take these medicines the morning of surgery with A SIP OF WATER: Singulair. Pepcid. Use eyedrops. Use and bring Symbicort inhaler. Bring ProAir inhaler. Bring Epi-Pen.    Do not wear jewelry, make-up or nail polish.  Do not wear lotions, powders, or perfumes. You may wear deodorant.  Do not shave 12 hours prior to first CHG shower(legs and under arms).(face and neck okay.)  Do not bring valuables to the hospital.  Contacts, dentures or bridgework,body piercing,  may not be worn into surgery.  Leave suitcase in the car. After surgery it may be brought to your room.  For patients admitted to the hospital, checkout time is 11:00 AM the day of discharge.   Patients discharged the day of surgery will not be allowed to drive home. Must have responsible person with you x 24 hours once discharged.  Name and phone number of your driver: Lashawndra Lampkins ,spouse [phone] cell  Special Instructions: CHG(Chlorhedine 4%-"Hibiclens","Betasept","Aplicare") Shower Use Special Wash: see special instructions.(avoid face and genitals)      Failure to follow these instructions may result in Cancellation of your surgery.   Patient signature_______________________________________________________

## 2012-12-24 DIAGNOSIS — T7840XA Allergy, unspecified, initial encounter: Secondary | ICD-10-CM | POA: Diagnosis not present

## 2012-12-28 ENCOUNTER — Encounter (HOSPITAL_COMMUNITY): Payer: Self-pay | Admitting: *Deleted

## 2012-12-28 ENCOUNTER — Encounter (HOSPITAL_COMMUNITY): Payer: Self-pay | Admitting: Anesthesiology

## 2012-12-28 ENCOUNTER — Ambulatory Visit (HOSPITAL_COMMUNITY)
Admission: RE | Admit: 2012-12-28 | Discharge: 2012-12-28 | Disposition: A | Discharge status: Home / Self Care | Payer: Medicare Other | Source: Ambulatory Visit | Attending: Urology | Admitting: Urology

## 2012-12-28 ENCOUNTER — Encounter (HOSPITAL_COMMUNITY)
Admission: RE | Disposition: A | Discharge status: Home / Self Care | Payer: Self-pay | Source: Ambulatory Visit | Attending: Urology

## 2012-12-28 ENCOUNTER — Ambulatory Visit (HOSPITAL_COMMUNITY): Payer: Medicare Other | Admitting: Anesthesiology

## 2012-12-28 DIAGNOSIS — R21 Rash and other nonspecific skin eruption: Secondary | ICD-10-CM | POA: Diagnosis not present

## 2012-12-28 DIAGNOSIS — M899 Disorder of bone, unspecified: Secondary | ICD-10-CM | POA: Diagnosis not present

## 2012-12-28 DIAGNOSIS — I10 Essential (primary) hypertension: Secondary | ICD-10-CM | POA: Insufficient documentation

## 2012-12-28 DIAGNOSIS — J45909 Unspecified asthma, uncomplicated: Secondary | ICD-10-CM | POA: Diagnosis not present

## 2012-12-28 DIAGNOSIS — R3129 Other microscopic hematuria: Secondary | ICD-10-CM | POA: Insufficient documentation

## 2012-12-28 DIAGNOSIS — G4730 Sleep apnea, unspecified: Secondary | ICD-10-CM | POA: Diagnosis not present

## 2012-12-28 DIAGNOSIS — M949 Disorder of cartilage, unspecified: Secondary | ICD-10-CM | POA: Insufficient documentation

## 2012-12-28 HISTORY — PX: CYSTOSCOPY W/ RETROGRADES: SHX1426

## 2012-12-28 SURGERY — CYSTOSCOPY, WITH RETROGRADE PYELOGRAM
Anesthesia: General | Laterality: Bilateral

## 2012-12-28 MED ORDER — PHENAZOPYRIDINE HCL 100 MG PO TABS: 100.0000 mg | ORAL_TABLET | Freq: Three times a day (TID) | ORAL | Status: DC | PRN

## 2012-12-28 MED ORDER — CIPROFLOXACIN IN D5W 400 MG/200ML IV SOLN
400.0000 mg | INTRAVENOUS | Status: AC
  Administered 2012-12-28: 400 mg via INTRAVENOUS

## 2012-12-28 MED ORDER — IOHEXOL 300 MG/ML  SOLN
INTRAMUSCULAR | Status: DC | PRN
  Administered 2012-12-28: 20 mL

## 2012-12-28 MED ORDER — LACTATED RINGERS IV SOLN
INTRAVENOUS | Status: DC
  Administered 2012-12-28: 1000 mL via INTRAVENOUS
  Administered 2012-12-28: 14:00:00 via INTRAVENOUS

## 2012-12-28 MED ORDER — PROMETHAZINE HCL 25 MG/ML IJ SOLN: 6.2500 mg | INTRAMUSCULAR | Status: DC | PRN

## 2012-12-28 MED ORDER — IOHEXOL 300 MG/ML  SOLN
INTRAMUSCULAR | Status: AC
  Filled 2012-12-28: qty 1

## 2012-12-28 MED ORDER — ONDANSETRON HCL 4 MG/2ML IJ SOLN
INTRAMUSCULAR | Status: DC | PRN
  Administered 2012-12-28 (×2): 2 mg via INTRAVENOUS

## 2012-12-28 MED ORDER — PROPOFOL 10 MG/ML IV BOLUS
INTRAVENOUS | Status: DC | PRN
  Administered 2012-12-28: 200 mg via INTRAVENOUS

## 2012-12-28 MED ORDER — FENTANYL CITRATE 0.05 MG/ML IJ SOLN: 25.0000 ug | INTRAMUSCULAR | Status: DC | PRN

## 2012-12-28 MED ORDER — FENTANYL CITRATE 0.05 MG/ML IJ SOLN
INTRAMUSCULAR | Status: DC | PRN
  Administered 2012-12-28 (×2): 50 ug via INTRAVENOUS

## 2012-12-28 SURGICAL SUPPLY — 18 items
ADAPTER CATH URET PLST 4-6FR (CATHETERS) ×1 IMPLANT
ADPR CATH URET STRL DISP 4-6FR (CATHETERS)
BAG URO CATCHER STRL LF (DRAPE) ×2 IMPLANT
BASKET ZERO TIP NITINOL 2.4FR (BASKET) IMPLANT
BSKT STON RTRVL ZERO TP 2.4FR (BASKET)
CATH INTERMIT  6FR 70CM (CATHETERS) IMPLANT
CATH URET 5FR 28IN OPEN ENDED (CATHETERS) ×1 IMPLANT
CLOTH BEACON ORANGE TIMEOUT ST (SAFETY) ×2 IMPLANT
DRAPE CAMERA CLOSED 9X96 (DRAPES) ×2 IMPLANT
GLOVE BIOGEL M 7.0 STRL (GLOVE) ×1 IMPLANT
GOWN STRL NON-REIN LRG LVL3 (GOWN DISPOSABLE) ×2 IMPLANT
GOWN STRL REIN XL XLG (GOWN DISPOSABLE) ×1 IMPLANT
GUIDEWIRE ANG ZIPWIRE 038X150 (WIRE) IMPLANT
GUIDEWIRE STR DUAL SENSOR (WIRE) ×2 IMPLANT
MANIFOLD NEPTUNE II (INSTRUMENTS) ×2 IMPLANT
PACK CYSTO (CUSTOM PROCEDURE TRAY) ×2 IMPLANT
SCRUB PCMX 4 OZ (MISCELLANEOUS) IMPLANT
TUBING CONNECTING 10 (TUBING) ×2 IMPLANT

## 2012-12-28 NOTE — Anesthesia Postprocedure Evaluation (Signed)
  Anesthesia Post-op Note  Patient: Angela Sheppard  Procedure(s) Performed: Procedure(s) (LRB): CYSTOSCOPY WITH RETROGRADE PYELOGRAM (Bilateral)  Patient Location: PACU  Anesthesia Type: General  Level of Consciousness: awake and alert   Airway and Oxygen Therapy: Patient Spontanous Breathing  Post-op Pain: mild  Post-op Assessment: Post-op Vital signs reviewed, Patient's Cardiovascular Status Stable, Respiratory Function Stable, Patent Airway and No signs of Nausea or vomiting  Last Vitals:  Filed Vitals:   12/28/12 1400  BP: 134/82  Pulse: 79  Temp:   Resp: 14    Post-op Vital Signs: stable   Complications: No apparent anesthesia complications. No complaints.

## 2012-12-28 NOTE — Preoperative (Signed)
Beta Blockers   Reason not to administer Beta Blockers:Not Applicable 

## 2012-12-28 NOTE — Op Note (Signed)
Urology Operative Report  Date of Procedure: 12/28/12  Surgeon: Natalia Leatherwood, MD Assistant:  None  Preoperative Diagnosis: Microhematuria Postoperative Diagnosis:  Same  Procedure(s): Cystoscopy Bilateral retrograde pyelograms.  Estimated blood loss: None  Specimen: None  Drains: None  Complications: None  Findings: Negative filling defects or hydronephrosis/hydroureter bilaterally.  History of present illness:  67 year-old female presents today for microscopic hematuria workup. She's had an MRI of the abdomen which was negative an office cystoscopy. She is here today to evaluate her ureters bilaterally. She is allergic to IVP dye and did not wish to have further cross-sectional imaging to evaluate her ureter; she has elected to proceed with bilateral retrograde pyelograms.  Procedure in detail: After informed consent was obtained, the patient was taken to the operating room. They were placed in the supine position. SCDs were turned on and in place. IV antibiotics were infused, and general anesthesia was induced. A timeout was performed in which the correct patient, surgical site, and procedure were identified and agreed upon by the team.  The patient was placed in a dorsolithotomy position, making sure to pad all pertinent neurovascular pressure points. The genitals were prepped and draped in the usual sterile fashion.  A rigid cystoscope was advanced through the urethra and into the bladder. The bladder was drained and evaluated in a systematic fashion after was fully distended with a 12 and 70 lens. As able to visualize the entire surface of the bladder and this was negative for tumors. Attention was turned to each ureter orifice which was cannulated with a 5 French ureter catheter. I was able to inject contrast to obtain a retrograde pyelogram but prevent passage of contrast into the venous system. There were no filling defects or hydronephrosis bilaterally. The bladder was  drained and she's placed back in a supine position, anesthesia was reversed and she was taken to the [facility] in stable condition.  All counts were correct at the end of the case.

## 2012-12-28 NOTE — H&P (Signed)
Urology History and Physical Exam  CC: Microhematuria  HPI:  67 year old female presents today for microscopic hematuria. This is a chronic problem. She worked up in 2013 which was negative. Is not associated with gross hematuria. She does have a significant past history of smoking. At workup in 2013 she had an MRI of the abdomen but not the pelvis. This revealed no hydronephrosis. We discussed that it is possible for a tumor to be in her ureter, but unlikely. She was lost to followup and returned with further microscopic hematuria. We discussed options including obtaining further cross-sectional imaging. She is allergic to IVP dye, but she could have an MRI of the pelvis. Her other option is cystoscopy and retrograde pyelogram. After discussing the risks and benefits she presents today for cystoscopy with bilateral retrograde pyelograms. We've discussed the risks, benefits, alternatives, and the likelihood of achieving goals. UA from 12/08/12 was negative for signs of infection.  PMH: Past Medical History  Diagnosis Date  . Anemia   . Osteopenia   . Asthma   . Diverticulosis of colon   . Headache(784.0)   . Osteoarthritis   . Psoriasis   . PONV (postoperative nausea and vomiting)   . Hypertension   . H/O bundle branch block 12-23-12    was told this- Ekg '02 with chart today  . Anxiety   . Sleep apnea     "borderline"-report with chart 8'04-no cpap  . Rash of entire body 12-23-12    12-20-12 "awaken with rash, facial swelling, and hives" after eating out "[employer] and bagel"-is lessing but remains  . Mass of adrenal gland   . History of kidney stones   . Family history of anesthesia complication     very sensitive to gold products, Versed/ Needs to sit up as quickly as possible once out of surgery.    PSH: Past Surgical History  Procedure Laterality Date  . Cholecystectomy    . Needle biopsy left breast      and right breast  . Excision lesion left ear    . Dilation and curettage  of uterus    . Blepharoplasty      and neck lift done  . Cystostomy w/ bladder biopsy    . Ganglions left wrist    . Colonoscopy  2008    per Dr. Russella Dar, repeat in 3 yrs  . Repair of fractued left radius with plate and screws  06/14/09    per Dr. Jodelle Gross    Allergies: Allergies  Allergen Reactions  . Other Anaphylaxis    Some injection used at the dentist. See sheet with allergy alert with chart.   . Demerol (Meperidine) Other (See Comments)    Hallucinations   . Gold Salts     Gold sodium sulfate: metal/jewelry/medical,dental, plates. causes swelling, redness, itching, fluid filled blisters" products with gold sodium thiosulfate"  . Medrogestone Other (See Comments)    Blacked out  . Sulfamethoxazole W-Trimethoprim   . Sulfur     REACTION: whelts  . Versed (Midazolam) Other (See Comments)    Aunt had a back reaction  . Cefdinir Rash  . Ivp Dye (Iodinated Diagnostic Agents) Rash    Turned red, metal taste in mouth  . Penicillins Rash    Medications: No prescriptions prior to admission     Social History: History   Social History  . Marital Status: Married    Spouse Name: N/A    Number of Children: N/A  . Years of Education: N/A  Occupational History  . Not on file.   Social History Main Topics  . Smoking status: Former Smoker -- 1.00 packs/day for 25 years    Quit date: 12/24/1991  . Smokeless tobacco: Never Used  . Alcohol Use: 1.0 oz/week    2 drink(s) per week  . Drug Use: No  . Sexually Active: Yes   Other Topics Concern  . Not on file   Social History Narrative  . No narrative on file    Family History: Family History  Problem Relation Age of Onset  . Cancer Other     breast, colon, bladder  . Coronary artery disease Other   . Diabetes Other   . Hypertension Other   . Stroke Other     Review of Systems: Positive: Rash Negative: Fever, chest pain, or SOB.  A further 10 point review of systems was negative except what is listed in  the HPI.  Physical Exam: Filed Vitals:   12/28/12 1125  BP: 152/83  Pulse: 101  Temp: 97.9 F (36.6 C)  Resp: 18    General: No acute distress.  Awake. Head:  Normocephalic.  Atraumatic. ENT:  EOMI.  Mucous membranes moist Neck:  Supple.  No lymphadenopathy. CV:  S1 present. S2 present. Regular rate. Pulmonary: Equal effort bilaterally.  Clear to auscultation bilaterally. Abdomen: Soft.  Non- tender to palpation. Skin:  Normal turgor.  No visible rash. Extremity: No gross deformity of bilateral upper extremities.  No gross deformity of    bilateral lower extremities. Neurologic: Alert. Appropriate mood.    Studies:  No results found for this basename: HGB, WBC, PLT,  in the last 72 hours  No results found for this basename: NA, K, CL, CO2, BUN, CREATININE, CALCIUM, MAGNESIUM, GFRNONAA, GFRAA,  in the last 72 hours   No results found for this basename: PT, INR, APTT,  in the last 72 hours   No components found with this basename: ABG,     Assessment:  Microhematuria  Plan: To OR for cystoscopy and bilateral retrograde pyelograms.

## 2012-12-28 NOTE — Transfer of Care (Signed)
Immediate Anesthesia Transfer of Care Note  Patient: Angela Sheppard  Procedure(s) Performed: Procedure(s): CYSTOSCOPY WITH RETROGRADE PYELOGRAM (Bilateral)  Patient Location: PACU  Anesthesia Type:General  Level of Consciousness: awake, alert , oriented and patient cooperative  Airway & Oxygen Therapy: Patient Spontanous Breathing and Patient connected to face mask oxygen  Post-op Assessment: Report given to PACU RN, Post -op Vital signs reviewed and stable and Patient moving all extremities  Post vital signs: Reviewed and stable  Complications: No apparent anesthesia complications

## 2012-12-28 NOTE — Anesthesia Preprocedure Evaluation (Addendum)
Anesthesia Evaluation  Patient identified by MRN, date of birth, ID band Patient awake  General Assessment Comment:.  Anemia     .  Osteopenia     .  Asthma     .  Diverticulosis of colon     .  Headache(784.0)     .  Osteoarthritis     .  Psoriasis     .  PONV (postoperative nausea and vomiting)     .  Hypertension     .  H/O bundle branch block  12-23-12       was told this- Ekg '02 with chart today   .  Anxiety     .  Sleep apnea         "borderline"-report with chart 8'04-no cpap   .  Rash of entire body  12-23-12       12-20-12 "awaken with rash, facial swelling, and hives" after eating out "[employer] and bagel"-is lessing but remains   .  Mass of adrenal gland     .  History of kidney stones     .  Family history of anesthesia complication         very sensitive to gold products, Versed/ Needs to sit up as quickly as possible once out of surgery.     Reviewed: Allergy & Precautions, H&P , NPO status , Patient's Chart, lab work & pertinent test results  History of Anesthesia Complications (+) PONV and Family history of anesthesia reaction  Airway Mallampati: II TM Distance: >3 FB Neck ROM: Full    Dental no notable dental hx.    Pulmonary asthma , sleep apnea , former smoker,  breath sounds clear to auscultation  Pulmonary exam normal       Cardiovascular Exercise Tolerance: Good hypertension, Pt. on medications Rhythm:Regular Rate:Normal  CXR/ECG results reviewed.   Neuro/Psych  Headaches, Anxiety    GI/Hepatic negative GI ROS, Neg liver ROS,   Endo/Other  negative endocrine ROS  Renal/GU negative Renal ROS  negative genitourinary   Musculoskeletal negative musculoskeletal ROS (+)   Abdominal (+) + obese,   Peds negative pediatric ROS (+)  Hematology negative hematology ROS (+)   Anesthesia Other Findings   Reproductive/Obstetrics negative OB ROS                           Anesthesia Physical Anesthesia Plan  ASA: II  Anesthesia Plan: General   Post-op Pain Management:    Induction: Intravenous  Airway Management Planned: LMA  Additional Equipment:   Intra-op Plan:   Post-operative Plan: Extubation in OR  Informed Consent: I have reviewed the patients History and Physical, chart, labs and discussed the procedure including the risks, benefits and alternatives for the proposed anesthesia with the patient or authorized representative who has indicated his/her understanding and acceptance.   Dental advisory given  Plan Discussed with: CRNA  Anesthesia Plan Comments:         Anesthesia Quick Evaluation

## 2012-12-29 ENCOUNTER — Encounter (HOSPITAL_COMMUNITY): Payer: Self-pay | Admitting: Urology

## 2013-01-06 DIAGNOSIS — Z01419 Encounter for gynecological examination (general) (routine) without abnormal findings: Secondary | ICD-10-CM | POA: Diagnosis not present

## 2013-01-06 DIAGNOSIS — M949 Disorder of cartilage, unspecified: Secondary | ICD-10-CM | POA: Diagnosis not present

## 2013-01-06 DIAGNOSIS — Z0000 Encounter for general adult medical examination without abnormal findings: Secondary | ICD-10-CM | POA: Diagnosis not present

## 2013-01-06 DIAGNOSIS — M899 Disorder of bone, unspecified: Secondary | ICD-10-CM | POA: Diagnosis not present

## 2013-01-27 ENCOUNTER — Other Ambulatory Visit: Payer: Self-pay

## 2013-02-23 DIAGNOSIS — D219 Benign neoplasm of connective and other soft tissue, unspecified: Secondary | ICD-10-CM | POA: Diagnosis not present

## 2013-02-23 DIAGNOSIS — L259 Unspecified contact dermatitis, unspecified cause: Secondary | ICD-10-CM | POA: Diagnosis not present

## 2013-02-23 DIAGNOSIS — D239 Other benign neoplasm of skin, unspecified: Secondary | ICD-10-CM | POA: Diagnosis not present

## 2013-02-23 DIAGNOSIS — R21 Rash and other nonspecific skin eruption: Secondary | ICD-10-CM | POA: Diagnosis not present

## 2013-02-23 DIAGNOSIS — T783XXA Angioneurotic edema, initial encounter: Secondary | ICD-10-CM | POA: Diagnosis not present

## 2013-02-23 DIAGNOSIS — L570 Actinic keratosis: Secondary | ICD-10-CM | POA: Diagnosis not present

## 2013-02-23 DIAGNOSIS — J45909 Unspecified asthma, uncomplicated: Secondary | ICD-10-CM | POA: Diagnosis not present

## 2013-02-23 DIAGNOSIS — D485 Neoplasm of uncertain behavior of skin: Secondary | ICD-10-CM | POA: Diagnosis not present

## 2013-02-24 DIAGNOSIS — Z23 Encounter for immunization: Secondary | ICD-10-CM | POA: Diagnosis not present

## 2013-04-08 ENCOUNTER — Ambulatory Visit
Admission: RE | Admit: 2013-04-08 | Discharge: 2013-04-08 | Disposition: A | Discharge status: Home / Self Care | Payer: Medicare Other | Source: Ambulatory Visit

## 2013-04-08 DIAGNOSIS — Z1231 Encounter for screening mammogram for malignant neoplasm of breast: Secondary | ICD-10-CM | POA: Diagnosis not present

## 2013-04-09 ENCOUNTER — Encounter (INDEPENDENT_AMBULATORY_CARE_PROVIDER_SITE_OTHER): Payer: Self-pay | Admitting: Surgery

## 2013-04-09 DIAGNOSIS — Z23 Encounter for immunization: Secondary | ICD-10-CM | POA: Diagnosis not present

## 2013-04-29 ENCOUNTER — Other Ambulatory Visit: Payer: Self-pay

## 2013-06-30 ENCOUNTER — Encounter: Payer: Self-pay | Admitting: Gastroenterology

## 2013-07-02 DIAGNOSIS — J45909 Unspecified asthma, uncomplicated: Secondary | ICD-10-CM | POA: Diagnosis not present

## 2013-07-02 DIAGNOSIS — I10 Essential (primary) hypertension: Secondary | ICD-10-CM | POA: Diagnosis not present

## 2013-07-02 DIAGNOSIS — E785 Hyperlipidemia, unspecified: Secondary | ICD-10-CM | POA: Diagnosis not present

## 2013-07-02 DIAGNOSIS — K219 Gastro-esophageal reflux disease without esophagitis: Secondary | ICD-10-CM | POA: Diagnosis not present

## 2013-08-24 DIAGNOSIS — R21 Rash and other nonspecific skin eruption: Secondary | ICD-10-CM | POA: Diagnosis not present

## 2013-08-24 DIAGNOSIS — L259 Unspecified contact dermatitis, unspecified cause: Secondary | ICD-10-CM | POA: Diagnosis not present

## 2013-08-24 DIAGNOSIS — T783XXA Angioneurotic edema, initial encounter: Secondary | ICD-10-CM | POA: Diagnosis not present

## 2013-08-24 DIAGNOSIS — J45909 Unspecified asthma, uncomplicated: Secondary | ICD-10-CM | POA: Diagnosis not present

## 2013-09-09 DIAGNOSIS — E871 Hypo-osmolality and hyponatremia: Secondary | ICD-10-CM | POA: Diagnosis not present

## 2013-09-09 DIAGNOSIS — E785 Hyperlipidemia, unspecified: Secondary | ICD-10-CM | POA: Diagnosis not present

## 2013-09-09 DIAGNOSIS — M79609 Pain in unspecified limb: Secondary | ICD-10-CM | POA: Diagnosis not present

## 2013-09-09 DIAGNOSIS — I10 Essential (primary) hypertension: Secondary | ICD-10-CM | POA: Diagnosis not present

## 2013-10-11 DIAGNOSIS — Z780 Asymptomatic menopausal state: Secondary | ICD-10-CM | POA: Diagnosis not present

## 2013-10-11 DIAGNOSIS — M7989 Other specified soft tissue disorders: Secondary | ICD-10-CM | POA: Diagnosis not present

## 2013-10-11 DIAGNOSIS — I10 Essential (primary) hypertension: Secondary | ICD-10-CM | POA: Diagnosis not present

## 2014-01-06 ENCOUNTER — Encounter: Payer: Self-pay | Admitting: Gastroenterology

## 2014-01-10 ENCOUNTER — Encounter: Payer: Self-pay | Admitting: Gastroenterology

## 2014-01-10 DIAGNOSIS — Z01419 Encounter for gynecological examination (general) (routine) without abnormal findings: Secondary | ICD-10-CM | POA: Diagnosis not present

## 2014-01-10 DIAGNOSIS — Z124 Encounter for screening for malignant neoplasm of cervix: Secondary | ICD-10-CM | POA: Diagnosis not present

## 2014-01-10 DIAGNOSIS — Z0000 Encounter for general adult medical examination without abnormal findings: Secondary | ICD-10-CM | POA: Diagnosis not present

## 2014-02-12 ENCOUNTER — Encounter: Payer: Self-pay | Admitting: Gastroenterology

## 2014-02-24 DIAGNOSIS — R21 Rash and other nonspecific skin eruption: Secondary | ICD-10-CM | POA: Diagnosis not present

## 2014-02-24 DIAGNOSIS — J45909 Unspecified asthma, uncomplicated: Secondary | ICD-10-CM | POA: Diagnosis not present

## 2014-02-24 DIAGNOSIS — T783XXA Angioneurotic edema, initial encounter: Secondary | ICD-10-CM | POA: Diagnosis not present

## 2014-02-24 DIAGNOSIS — L259 Unspecified contact dermatitis, unspecified cause: Secondary | ICD-10-CM | POA: Diagnosis not present

## 2014-03-01 ENCOUNTER — Encounter: Payer: Self-pay | Admitting: Gastroenterology

## 2014-03-01 ENCOUNTER — Encounter: Payer: Medicare Other | Admitting: Gastroenterology

## 2014-03-15 ENCOUNTER — Encounter: Payer: Medicare Other | Admitting: Gastroenterology

## 2014-04-12 DIAGNOSIS — Z1231 Encounter for screening mammogram for malignant neoplasm of breast: Secondary | ICD-10-CM | POA: Diagnosis not present

## 2014-04-29 ENCOUNTER — Ambulatory Visit (AMBULATORY_SURGERY_CENTER): Payer: Self-pay

## 2014-04-29 VITALS — Ht 65.0 in | Wt 198.4 lb

## 2014-04-29 DIAGNOSIS — Z800 Family history of malignant neoplasm of digestive organs: Secondary | ICD-10-CM

## 2014-04-29 DIAGNOSIS — Z86010 Personal history of colonic polyps: Secondary | ICD-10-CM

## 2014-04-29 MED ORDER — MOVIPREP 100 G PO SOLR: ORAL | Status: DC

## 2014-04-29 NOTE — Progress Notes (Signed)
Per pt,some sedation cause nausea and vomiting.Some gold in metal supplies and jewelry causes swelling in her throat.     Per pt, no allergies to soy or egg products.Pt not taking any weight loss meds or using  O2 at home.

## 2014-05-13 ENCOUNTER — Encounter: Payer: Self-pay | Admitting: Gastroenterology

## 2014-05-13 ENCOUNTER — Ambulatory Visit (AMBULATORY_SURGERY_CENTER): Payer: Medicare Other | Admitting: Gastroenterology

## 2014-05-13 VITALS — BP 154/71 | HR 80 | Temp 98.8°F | Resp 15 | Ht 65.0 in | Wt 198.0 lb

## 2014-05-13 DIAGNOSIS — K621 Rectal polyp: Secondary | ICD-10-CM

## 2014-05-13 DIAGNOSIS — D129 Benign neoplasm of anus and anal canal: Secondary | ICD-10-CM

## 2014-05-13 DIAGNOSIS — D125 Benign neoplasm of sigmoid colon: Secondary | ICD-10-CM

## 2014-05-13 DIAGNOSIS — I10 Essential (primary) hypertension: Secondary | ICD-10-CM | POA: Diagnosis not present

## 2014-05-13 DIAGNOSIS — Z1211 Encounter for screening for malignant neoplasm of colon: Secondary | ICD-10-CM | POA: Diagnosis not present

## 2014-05-13 DIAGNOSIS — Z86010 Personal history of colonic polyps: Secondary | ICD-10-CM

## 2014-05-13 DIAGNOSIS — K635 Polyp of colon: Secondary | ICD-10-CM

## 2014-05-13 DIAGNOSIS — G4730 Sleep apnea, unspecified: Secondary | ICD-10-CM | POA: Diagnosis not present

## 2014-05-13 DIAGNOSIS — D128 Benign neoplasm of rectum: Secondary | ICD-10-CM

## 2014-05-13 MED ORDER — SODIUM CHLORIDE 0.9 % IV SOLN: 500.0000 mL | INTRAVENOUS | Status: DC

## 2014-05-13 NOTE — Op Note (Signed)
[facility]  [address] [place], [address]   COLONOSCOPY PROCEDURE REPORT  PATIENT: Angela Sheppard, Angela Sheppard  MR#: [id number] BIRTHDATE: 05-27-1946 , 84  yrs. old GENDER: female ENDOSCOPIST: Ladene Artist, MD, [facility] PROCEDURE DATE:  05/13/2014 PROCEDURE:   Colonoscopy with biopsy and Colonoscopy with snare polypectomy First Screening Colonoscopy - Avg.  risk and is 50 yrs.  old or older - No.  Prior Negative Screening - Now for repeat screening. N/A  History of Adenoma - Now for follow-up colonoscopy & has been > or = to 3 yrs.  Yes hx of adenoma.  Has been 3 or more years since last colonoscopy.  Polyps Removed Today? Yes. ASA CLASS:   Class III INDICATIONS:surveillance colonoscopy based on a history of adenomatous colonic polyp(s). MEDICATIONS: Monitored anesthesia care and Propofol 250 mg IV DESCRIPTION OF PROCEDURE:   After the risks benefits and alternatives of the procedure were thoroughly explained, informed consent was obtained.  The digital rectal exam revealed no abnormalities of the rectum.   The LB [id number]  endoscope was introduced through the anus and advanced to the cecum, which was identified by both the appendix and ileocecal valve. No adverse events experienced.   The quality of the prep was excellent, using MoviPrep  The instrument was then slowly withdrawn as the colon was fully examined.  COLON FINDINGS: A sessile polyp measuring 6 mm in size was found in the sigmoid colon.  A polypectomy was performed with a cold snare. The resection was complete, the polyp tissue was completely retrieved and sent to histology.   Two sessile polyps measuring 4 mm in size were found in the rectum.  A polypectomy was performed with cold forceps.  The resection was complete, the polyp tissue was completely retrieved and sent to histology.   There was diverticulosis noted in the sigmoid colon.   The examination was otherwise normal.  Retroflexed views  revealed no abnormalities. The time to cecum=2 minutes 44 seconds.  Withdrawal time=9 minutes 57 seconds.  The scope was withdrawn and the procedure completed. COMPLICATIONS: There were no immediate complications.  ENDOSCOPIC IMPRESSION: 1.   Sessile polyp in the sigmoid colon; polypectomy performed with a cold snare 2.   Two sessile polyps in the rectum; polypectomy performed with cold forceps 3.   Diverticulosis in the sigmoid colon  RECOMMENDATIONS: 1.  Await pathology results 2.  High fiber diet with liberal fluid intake. 3.  Repeat Colonoscopy in 5 years.  eSigned:  Ladene Artist, MD, [facility] 05/13/2014 8:27 AM   cc: Dimas Aguas, MD

## 2014-05-13 NOTE — Patient Instructions (Signed)
YOU HAD AN ENDOSCOPIC PROCEDURE TODAY AT THE [facility]: Refer to the procedure report that was given to you for any specific questions about what was found during the examination.  If the procedure report does not answer your questions, please call your gastroenterologist to clarify.  If you requested that your care partner not be given the details of your procedure findings, then the procedure report has been included in a sealed envelope for you to review at your convenience later.  YOU SHOULD EXPECT: Some feelings of bloating in the abdomen. Passage of more gas than usual.  Walking can help get rid of the air that was put into your GI tract during the procedure and reduce the bloating. If you had a lower endoscopy (such as a colonoscopy or flexible sigmoidoscopy) you may notice spotting of blood in your stool or on the toilet paper. If you underwent a bowel prep for your procedure, then you may not have a normal bowel movement for a few days.  DIET: Your first meal following the procedure should be a light meal and then it is ok to progress to your normal diet.  A half-sandwich or bowl of soup is an example of a good first meal.  Heavy or fried foods are harder to digest and may make you feel nauseous or bloated.  Likewise meals heavy in dairy and vegetables can cause extra gas to form and this can also increase the bloating.  Drink plenty of fluids but you should avoid alcoholic beverages for 24 hours.  ACTIVITY: Your care partner should take you home directly after the procedure.  You should plan to take it easy, moving slowly for the rest of the day.  You can resume normal activity the day after the procedure however you should NOT DRIVE or use heavy machinery for 24 hours (because of the sedation medicines used during the test).    SYMPTOMS TO REPORT IMMEDIATELY: A gastroenterologist can be reached at any hour.  During normal business hours, 8:30 AM to 5:00 PM Monday through Friday,  call [phone].  After hours and on weekends, please call the GI answering service at [phone] who will take a message and have the physician on call contact you.   Following lower endoscopy (colonoscopy or flexible sigmoidoscopy):  Excessive amounts of blood in the stool  Significant tenderness or worsening of abdominal pains  Swelling of the abdomen that is new, acute  Fever of 100F or higher  FOLLOW UP: If any biopsies were taken you will be contacted by phone or by letter within the next 1-3 weeks.  Call your gastroenterologist if you have not heard about the biopsies in 3 weeks.  Our staff will call the home number listed on your records the next business day following your procedure to check on you and address any questions or concerns that you may have at that time regarding the information given to you following your procedure. This is a courtesy call and so if there is no answer at the home number and we have not heard from you through the emergency physician on call, we will assume that you have returned to your regular daily activities without incident.  SIGNATURES/CONFIDENTIALITY: You and/or your care partner have signed paperwork which will be entered into your electronic medical record.  These signatures attest to the fact that that the information above on your After Visit Summary has been reviewed and is understood.  Full responsibility of the confidentiality of this   discharge information lies with you and/or your care-partner.  Recommendations Next colonoscopy in 5 years. Diverticulosis, high fiber diet, and polyp handouts provided to patient/care partner.

## 2014-05-13 NOTE — Progress Notes (Signed)
Patient awakening,vss,report to rn 

## 2014-05-13 NOTE — Progress Notes (Signed)
Called to room to assist during endoscopic procedure.  Patient ID and intended procedure confirmed with present staff. Received instructions for my participation in the procedure from the performing physician.  

## 2014-05-16 ENCOUNTER — Telehealth: Payer: Self-pay

## 2014-05-16 NOTE — Telephone Encounter (Signed)
Left message on answering machine. 

## 2014-05-23 ENCOUNTER — Encounter: Payer: Self-pay | Admitting: Gastroenterology

## 2014-08-25 DIAGNOSIS — R21 Rash and other nonspecific skin eruption: Secondary | ICD-10-CM | POA: Diagnosis not present

## 2014-08-25 DIAGNOSIS — T783XXA Angioneurotic edema, initial encounter: Secondary | ICD-10-CM | POA: Diagnosis not present

## 2014-08-25 DIAGNOSIS — J4540 Moderate persistent asthma, uncomplicated: Secondary | ICD-10-CM | POA: Diagnosis not present

## 2014-08-25 DIAGNOSIS — J301 Allergic rhinitis due to pollen: Secondary | ICD-10-CM | POA: Diagnosis not present

## 2014-12-19 ENCOUNTER — Other Ambulatory Visit: Payer: Self-pay

## 2015-01-03 ENCOUNTER — Other Ambulatory Visit: Payer: Self-pay | Admitting: Family Medicine

## 2015-01-04 ENCOUNTER — Other Ambulatory Visit: Payer: Self-pay | Admitting: Family Medicine

## 2015-01-12 DIAGNOSIS — K130 Diseases of lips: Secondary | ICD-10-CM | POA: Diagnosis not present

## 2015-01-12 DIAGNOSIS — J45909 Unspecified asthma, uncomplicated: Secondary | ICD-10-CM | POA: Diagnosis not present

## 2015-01-12 DIAGNOSIS — E785 Hyperlipidemia, unspecified: Secondary | ICD-10-CM | POA: Diagnosis not present

## 2015-01-12 DIAGNOSIS — Z23 Encounter for immunization: Secondary | ICD-10-CM | POA: Diagnosis not present

## 2015-01-12 DIAGNOSIS — I10 Essential (primary) hypertension: Secondary | ICD-10-CM | POA: Diagnosis not present

## 2015-01-17 DIAGNOSIS — K130 Diseases of lips: Secondary | ICD-10-CM | POA: Diagnosis not present

## 2015-01-17 DIAGNOSIS — I10 Essential (primary) hypertension: Secondary | ICD-10-CM | POA: Diagnosis not present

## 2015-01-17 DIAGNOSIS — E785 Hyperlipidemia, unspecified: Secondary | ICD-10-CM | POA: Diagnosis not present

## 2015-01-17 DIAGNOSIS — J45909 Unspecified asthma, uncomplicated: Secondary | ICD-10-CM | POA: Diagnosis not present

## 2015-01-17 DIAGNOSIS — Z23 Encounter for immunization: Secondary | ICD-10-CM | POA: Diagnosis not present

## 2015-03-02 DIAGNOSIS — H1045 Other chronic allergic conjunctivitis: Secondary | ICD-10-CM | POA: Diagnosis not present

## 2015-03-02 DIAGNOSIS — T783XXD Angioneurotic edema, subsequent encounter: Secondary | ICD-10-CM | POA: Diagnosis not present

## 2015-03-02 DIAGNOSIS — J301 Allergic rhinitis due to pollen: Secondary | ICD-10-CM | POA: Diagnosis not present

## 2015-03-02 DIAGNOSIS — J4540 Moderate persistent asthma, uncomplicated: Secondary | ICD-10-CM | POA: Diagnosis not present

## 2015-03-03 ENCOUNTER — Ambulatory Visit (INDEPENDENT_AMBULATORY_CARE_PROVIDER_SITE_OTHER): Payer: Medicare Other | Admitting: Gastroenterology

## 2015-03-03 ENCOUNTER — Encounter: Payer: Self-pay | Admitting: Gastroenterology

## 2015-03-03 VITALS — BP 128/82 | HR 80 | Ht 65.0 in | Wt 196.2 lb

## 2015-03-03 DIAGNOSIS — K219 Gastro-esophageal reflux disease without esophagitis: Secondary | ICD-10-CM

## 2015-03-03 NOTE — Progress Notes (Signed)
    History of Present Illness: This is a 69 year old female complaining of frequent reflux symptoms. She relates that she has had problems with reflux for many years however over the past year her symptoms have become much less frequent. She notes mild symptoms occurring about 3-4 times per month respond that promptly improve TUMS. She states she had an upper endoscopy performed in 1999 that was normal. I cannot locate records of that procedure at [facility]. Denies weight loss, abdominal pain, constipation, diarrhea, change in stool caliber, melena, hematochezia, nausea, vomiting, dysphagia, chest pain.  Current Medications, Allergies, Past Medical History, Past Surgical History, Family History and Social History were reviewed in [employer] record.  Physical Exam: General: Well developed , well nourished, no acute distress Head: Normocephalic and atraumatic Eyes:  sclerae anicteric, EOMI Ears: Normal auditory acuity Mouth: No deformity or lesions Lungs: Clear throughout to auscultation Heart: Regular rate and rhythm; no murmurs, rubs or bruits Abdomen: Soft, non tender and non distended. No masses, hepatosplenomegaly or hernias noted. Normal Bowel sounds Musculoskeletal: Symmetrical with no gross deformities  Pulses:  Normal pulses noted Extremities: No clubbing, cyanosis, edema or deformities noted Neurological: Alert oriented x 4, grossly nonfocal Psychological:  Alert and cooperative. Normal mood and affect  Assessment and Recommendations:  1. GERD. Symptoms less frequent over the past year. Offered the option of proceeding with EGD for further evaluation or treating more aggressively with daily famotidine and standard antireflux measures and assessing response. Continue TUMS prn. She prefers the latter option and if her symptoms do not resolve of the next 1-2 months she would be interested in scheduling an EGD. She is advised to contact us with a progress report in 1-2  months.

## 2015-03-03 NOTE — Patient Instructions (Addendum)
Food Choices for Gastroesophageal Reflux Disease When you have gastroesophageal reflux disease (GERD), the foods you eat and your eating habits are very important. Choosing the right foods can help ease the discomfort of GERD. WHAT GENERAL GUIDELINES DO I NEED TO FOLLOW?  Choose fruits, vegetables, whole grains, low-fat dairy products, and low-fat meat, fish, and poultry.  Limit fats such as oils, salad dressings, butter, nuts, and avocado.  Keep a food diary to identify foods that cause symptoms.  Avoid foods that cause reflux. These may be different for different people.  Eat frequent small meals instead of three large meals each day.  Eat your meals slowly, in a relaxed setting.  Limit fried foods.  Cook foods using methods other than frying.  Avoid drinking alcohol.  Avoid drinking large amounts of liquids with your meals.  Avoid bending over or lying down until 2-3 hours after eating. WHAT FOODS ARE NOT RECOMMENDED? The following are some foods and drinks that may worsen your symptoms: Vegetables Tomatoes. Tomato juice. Tomato and spaghetti sauce. Chili peppers. Onion and garlic. Horseradish. Fruits Oranges, grapefruit, and lemon (fruit and juice). Meats High-fat meats, fish, and poultry. This includes hot dogs, ribs, ham, sausage, salami, and bacon. Dairy Whole milk and chocolate milk. Sour cream. Cream. Butter. Ice cream. Cream cheese.  Beverages Coffee and tea, with or without caffeine. Carbonated beverages or energy drinks. Condiments Hot sauce. Barbecue sauce.  Sweets/Desserts Chocolate and cocoa. Donuts. Peppermint and spearmint. Fats and Oils High-fat foods, including [place] fries and potato chips. Other Vinegar. Strong spices, such as black pepper, white pepper, red pepper, cayenne, curry powder, cloves, ginger, and chili powder. The items listed above may not be a complete list of foods and beverages to avoid. Contact your dietitian for more  information. Document Released: 06/10/2005 Document Revised: 06/15/2013 Document Reviewed: 04/14/2013 [facility] Patient Information 2015 [place], [place]. This information is not intended to replace advice given to you by your health care provider. Make sure you discuss any questions you have with your health care provider.  Please purchase the following medications over the counter and take as directed: Famotidine-Please contact our office if symptoms are not getting any better in the next 3 months.

## 2015-04-24 DIAGNOSIS — Z23 Encounter for immunization: Secondary | ICD-10-CM | POA: Diagnosis not present

## 2015-09-15 DIAGNOSIS — H1045 Other chronic allergic conjunctivitis: Secondary | ICD-10-CM | POA: Diagnosis not present

## 2015-09-15 DIAGNOSIS — J4540 Moderate persistent asthma, uncomplicated: Secondary | ICD-10-CM | POA: Diagnosis not present

## 2015-09-15 DIAGNOSIS — J301 Allergic rhinitis due to pollen: Secondary | ICD-10-CM | POA: Diagnosis not present

## 2015-09-15 DIAGNOSIS — T783XXD Angioneurotic edema, subsequent encounter: Secondary | ICD-10-CM | POA: Diagnosis not present

## 2015-09-19 ENCOUNTER — Other Ambulatory Visit: Payer: Self-pay

## 2015-09-19 DIAGNOSIS — Z1231 Encounter for screening mammogram for malignant neoplasm of breast: Secondary | ICD-10-CM

## 2015-09-19 DIAGNOSIS — E785 Hyperlipidemia, unspecified: Secondary | ICD-10-CM | POA: Diagnosis not present

## 2015-09-19 DIAGNOSIS — I10 Essential (primary) hypertension: Secondary | ICD-10-CM | POA: Diagnosis not present

## 2015-10-04 ENCOUNTER — Ambulatory Visit
Admission: RE | Admit: 2015-10-04 | Discharge: 2015-10-04 | Disposition: A | Discharge status: Home / Self Care | Payer: Medicare Other | Source: Ambulatory Visit

## 2015-10-04 DIAGNOSIS — Z1231 Encounter for screening mammogram for malignant neoplasm of breast: Secondary | ICD-10-CM | POA: Diagnosis not present

## 2015-10-19 HISTORY — PX: BREAST BIOPSY: SHX20

## 2015-10-25 DIAGNOSIS — S52532D Colles' fracture of left radius, subsequent encounter for closed fracture with routine healing: Secondary | ICD-10-CM | POA: Diagnosis not present

## 2015-11-29 DIAGNOSIS — Z23 Encounter for immunization: Secondary | ICD-10-CM | POA: Diagnosis not present

## 2015-11-29 DIAGNOSIS — L03113 Cellulitis of right upper limb: Secondary | ICD-10-CM | POA: Diagnosis not present

## 2015-11-29 DIAGNOSIS — W5501XA Bitten by cat, initial encounter: Secondary | ICD-10-CM | POA: Diagnosis not present

## 2015-12-06 DIAGNOSIS — L03113 Cellulitis of right upper limb: Secondary | ICD-10-CM | POA: Diagnosis not present

## 2016-03-18 DIAGNOSIS — J301 Allergic rhinitis due to pollen: Secondary | ICD-10-CM | POA: Diagnosis not present

## 2016-03-18 DIAGNOSIS — T783XXD Angioneurotic edema, subsequent encounter: Secondary | ICD-10-CM | POA: Diagnosis not present

## 2016-03-18 DIAGNOSIS — H1045 Other chronic allergic conjunctivitis: Secondary | ICD-10-CM | POA: Diagnosis not present

## 2016-03-18 DIAGNOSIS — J4540 Moderate persistent asthma, uncomplicated: Secondary | ICD-10-CM | POA: Diagnosis not present

## 2016-03-18 DIAGNOSIS — Z23 Encounter for immunization: Secondary | ICD-10-CM | POA: Diagnosis not present

## 2016-03-27 DIAGNOSIS — E7800 Pure hypercholesterolemia, unspecified: Secondary | ICD-10-CM | POA: Diagnosis not present

## 2016-03-27 DIAGNOSIS — Z0000 Encounter for general adult medical examination without abnormal findings: Secondary | ICD-10-CM | POA: Diagnosis not present

## 2016-03-27 DIAGNOSIS — J45909 Unspecified asthma, uncomplicated: Secondary | ICD-10-CM | POA: Diagnosis not present

## 2016-03-27 DIAGNOSIS — Z1159 Encounter for screening for other viral diseases: Secondary | ICD-10-CM | POA: Diagnosis not present

## 2016-03-27 DIAGNOSIS — I10 Essential (primary) hypertension: Secondary | ICD-10-CM | POA: Diagnosis not present

## 2016-06-16 DIAGNOSIS — J069 Acute upper respiratory infection, unspecified: Secondary | ICD-10-CM | POA: Diagnosis not present

## 2016-06-19 DIAGNOSIS — J209 Acute bronchitis, unspecified: Secondary | ICD-10-CM | POA: Diagnosis not present

## 2016-06-19 DIAGNOSIS — J069 Acute upper respiratory infection, unspecified: Secondary | ICD-10-CM | POA: Diagnosis not present

## 2016-06-19 DIAGNOSIS — R05 Cough: Secondary | ICD-10-CM | POA: Diagnosis not present

## 2016-06-26 DIAGNOSIS — J4521 Mild intermittent asthma with (acute) exacerbation: Secondary | ICD-10-CM | POA: Diagnosis not present

## 2016-06-26 DIAGNOSIS — J069 Acute upper respiratory infection, unspecified: Secondary | ICD-10-CM | POA: Diagnosis not present

## 2016-07-02 DIAGNOSIS — R05 Cough: Secondary | ICD-10-CM | POA: Diagnosis not present

## 2016-07-04 DIAGNOSIS — R05 Cough: Secondary | ICD-10-CM | POA: Diagnosis not present

## 2016-07-18 DIAGNOSIS — J45909 Unspecified asthma, uncomplicated: Secondary | ICD-10-CM | POA: Diagnosis not present

## 2016-07-18 DIAGNOSIS — J181 Lobar pneumonia, unspecified organism: Secondary | ICD-10-CM | POA: Diagnosis not present

## 2016-08-22 DIAGNOSIS — J189 Pneumonia, unspecified organism: Secondary | ICD-10-CM | POA: Diagnosis not present

## 2016-09-16 DIAGNOSIS — T783XXD Angioneurotic edema, subsequent encounter: Secondary | ICD-10-CM | POA: Diagnosis not present

## 2016-09-16 DIAGNOSIS — H1045 Other chronic allergic conjunctivitis: Secondary | ICD-10-CM | POA: Diagnosis not present

## 2016-09-16 DIAGNOSIS — J4540 Moderate persistent asthma, uncomplicated: Secondary | ICD-10-CM | POA: Diagnosis not present

## 2016-09-16 DIAGNOSIS — J301 Allergic rhinitis due to pollen: Secondary | ICD-10-CM | POA: Diagnosis not present

## 2016-09-19 DIAGNOSIS — J189 Pneumonia, unspecified organism: Secondary | ICD-10-CM | POA: Diagnosis not present

## 2016-09-26 ENCOUNTER — Other Ambulatory Visit: Payer: Self-pay | Admitting: Family Medicine

## 2016-09-26 DIAGNOSIS — Z1231 Encounter for screening mammogram for malignant neoplasm of breast: Secondary | ICD-10-CM

## 2016-10-09 DIAGNOSIS — H2511 Age-related nuclear cataract, right eye: Secondary | ICD-10-CM | POA: Diagnosis not present

## 2016-10-09 DIAGNOSIS — H2512 Age-related nuclear cataract, left eye: Secondary | ICD-10-CM | POA: Diagnosis not present

## 2016-10-23 ENCOUNTER — Ambulatory Visit
Admission: RE | Admit: 2016-10-23 | Discharge: 2016-10-23 | Disposition: A | Discharge status: Home / Self Care | Payer: Medicare Other | Source: Ambulatory Visit | Attending: Family Medicine | Admitting: Family Medicine

## 2016-10-23 DIAGNOSIS — Z1231 Encounter for screening mammogram for malignant neoplasm of breast: Secondary | ICD-10-CM

## 2016-12-07 ENCOUNTER — Emergency Department (HOSPITAL_COMMUNITY)
Admission: EM | Admit: 2016-12-07 | Discharge: 2016-12-07 | Disposition: A | Discharge status: Home / Self Care | Payer: Medicare Other | Attending: Emergency Medicine | Admitting: Emergency Medicine

## 2016-12-07 ENCOUNTER — Encounter (HOSPITAL_COMMUNITY): Payer: Self-pay | Admitting: Oncology

## 2016-12-07 ENCOUNTER — Emergency Department (HOSPITAL_COMMUNITY): Payer: Medicare Other

## 2016-12-07 DIAGNOSIS — M79671 Pain in right foot: Secondary | ICD-10-CM | POA: Diagnosis not present

## 2016-12-07 DIAGNOSIS — Z79899 Other long term (current) drug therapy: Secondary | ICD-10-CM | POA: Insufficient documentation

## 2016-12-07 DIAGNOSIS — W1842XA Slipping, tripping and stumbling without falling due to stepping into hole or opening, initial encounter: Secondary | ICD-10-CM | POA: Insufficient documentation

## 2016-12-07 DIAGNOSIS — S99921A Unspecified injury of right foot, initial encounter: Secondary | ICD-10-CM | POA: Diagnosis present

## 2016-12-07 DIAGNOSIS — I10 Essential (primary) hypertension: Secondary | ICD-10-CM | POA: Insufficient documentation

## 2016-12-07 DIAGNOSIS — S92351A Displaced fracture of fifth metatarsal bone, right foot, initial encounter for closed fracture: Secondary | ICD-10-CM | POA: Diagnosis not present

## 2016-12-07 DIAGNOSIS — Y999 Unspecified external cause status: Secondary | ICD-10-CM | POA: Diagnosis not present

## 2016-12-07 DIAGNOSIS — M7989 Other specified soft tissue disorders: Secondary | ICD-10-CM | POA: Diagnosis not present

## 2016-12-07 DIAGNOSIS — S92353A Displaced fracture of fifth metatarsal bone, unspecified foot, initial encounter for closed fracture: Secondary | ICD-10-CM

## 2016-12-07 DIAGNOSIS — Y9301 Activity, walking, marching and hiking: Secondary | ICD-10-CM | POA: Insufficient documentation

## 2016-12-07 DIAGNOSIS — J45909 Unspecified asthma, uncomplicated: Secondary | ICD-10-CM | POA: Diagnosis not present

## 2016-12-07 DIAGNOSIS — Z7982 Long term (current) use of aspirin: Secondary | ICD-10-CM | POA: Diagnosis not present

## 2016-12-07 DIAGNOSIS — Y929 Unspecified place or not applicable: Secondary | ICD-10-CM | POA: Diagnosis not present

## 2016-12-07 DIAGNOSIS — Z87891 Personal history of nicotine dependence: Secondary | ICD-10-CM | POA: Diagnosis not present

## 2016-12-07 NOTE — ED Triage Notes (Signed)
Per pt she states that she was outside w/ her dogs when she fell causing pain to her right foot.  Swelling noted to right foot.  Pt denies dizziness prior to fall, hitting head or LOC.  Pt rates pain 2/10.

## 2016-12-07 NOTE — ED Provider Notes (Signed)
[facility] DEPT Provider Note   CSN: [id number] Arrival date & time: 12/07/16  1915     History   Chief Complaint Chief Complaint  Patient presents with  . Foot Pain    HPI Angela Sheppard is a 71 y.o. female who presents with right foot pain and swelling.  She reports that this morning she was outside walking the dogs when the dogs tripped her up causing her to step backwards onto a "grasp bump" and invert her right foot.  She reports that she heard a pop and that throughout the day her pain and swelling have both increased.  She has crutches in the car here, she lives with her husband and grand-daughter. He reports that this is an isolated injury, did not fall, did not strike her head, no LOC. She was well prior to event.   HPI  Past Medical History:  Diagnosis Date  . Anemia   . Anxiety   . Asthma   . Diverticulosis of colon   . Family history of anesthesia complication    very sensitive to gold products, Versed/ Needs to sit up as quickly as possible once out of surgery.  . H/O bundle branch block 12-23-12   was told this- Ekg '02 with chart today  . Headache(784.0)   . History of kidney stones   . Hypertension   . 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 Osteoarthritis   . Osteopenia   . PONV (postoperative nausea and vomiting)   . Psoriasis   . Rash of entire body 12-23-12   12-20-12 "awaken with rash, facial swelling, and hives" after eating out "[employer]"-is lessing but remains  . Sleep apnea    "borderline"-report with chart 8'04-no cpap    Patient Active Problem List   Diagnosis Date Noted  . HEMATURIA UNSPECIFIED 04/04/2010  . ADVERSE DRUG REACTION 08/29/2009  . VIRAL URI 04/11/2009  . ACUTE BRONCHITIS 10/03/2008  . HYPERLIPIDEMIA 12/29/2007  . ESSENTIAL HYPERTENSION 12/29/2007  . OSTEOARTHRITIS 12/29/2007  . HEADACHE 12/29/2007  . NEPHROLITHIASIS, HX OF 12/29/2007  . OSTEOPOROSIS NOS 01/26/2007  . ANEMIA-NOS 11/10/2006  . ASTHMA 11/10/2006  .  Diverticulosis of colon (without mention of hemorrhage) 11/10/2006  . LUMP OR MASS IN BREAST 11/10/2006  . PSORIASIS 11/10/2006  . GANGLION, JOINT 11/10/2006  . OSTEOPENIA 11/10/2006  . SLEEP APNEA 11/10/2006    Past Surgical History:  Procedure Laterality Date  . BLEPHAROPLASTY  2002   and neck lift done  . BREAST BIOPSY Right 10/19/2015   benign  . BREAST EXCISIONAL BIOPSY Left 2003   benign  . CHOLECYSTECTOMY    . COLONOSCOPY  2008   per Dr. Fuller Plan, repeat in 3 yrs  . CYSTOSCOPY W/ RETROGRADES Bilateral 12/28/2012   Procedure: CYSTOSCOPY WITH RETROGRADE PYELOGRAM;  Surgeon: Molli Hazard, MD;  Location: [facility];  Service: Urology;  Laterality: Bilateral;  . CYSTOSTOMY W/ BLADDER BIOPSY    . DILATION AND CURETTAGE OF UTERUS    . excision lesion left ear    . ganglions left wrist    . needle biopsy left breast     and right breast  . repair of fractued left radius with plate and screws  42/87/68   per Dr. Melvyn Novas    OB History    No data available       Home Medications    Prior to Admission medications   Medication Sig Start Date End Date Taking? Authorizing Provider  albuterol (PROVENTIL HFA;VENTOLIN HFA)  108 (90 BASE) MCG/ACT inhaler Inhale 2 puffs into the lungs every 6 (six) hours as needed for wheezing.    [provider]  albuterol (PROVENTIL) (2.5 MG/3ML) 0.083% nebulizer solution Take 3 mLs (2.5 mg total) by nebulization every 4 (four) hours as needed for wheezing or shortness of breath. 05/03/11 12/18/12  Laurey Morale, MD  aspirin 81 MG tablet Take 81 mg by mouth daily.      [provider]  calcium carbonate (TUMS - DOSED IN MG ELEMENTAL CALCIUM) 500 MG chewable tablet Chew 1 tablet by mouth daily.    [provider]  EPINEPHrine (EPIPEN 2-PAK) 0.3 mg/0.3 mL SOAJ Inject 0.3 mg into the muscle once. Yellow jackets    [provider]  fish oil-omega-3 fatty acids 1000 MG capsule Take 1 g by mouth daily.    [provider]  glucosamine-chondroitin 500-400 MG tablet Take 1 tablet by mouth daily.    [provider]  losartan (COZAAR) 100 MG tablet Take 100 mg by mouth daily.    [provider]  montelukast (SINGULAIR) 10 MG tablet Take 10 mg by mouth at bedtime.    [provider]  Multiple Vitamin (MULTIVITAMIN WITH MINERALS) TABS Take 1 tablet by mouth daily.    [provider]  polyvinyl alcohol (LIQUIFILM TEARS) 1.4 % ophthalmic solution Place 1 drop into both eyes 2 (two) times daily.    [provider]  simvastatin (ZOCOR) 20 MG tablet Take 20 mg by mouth at bedtime.    [provider]    Family History Family History  Problem Relation Age of Onset  . Colon polyps Sister   . Colon cancer Brother   . Coronary artery disease Other   . Diabetes Other   . Hypertension Other   . Stroke Other   . Crohn's disease Brother   . Breast cancer Unknown   . Bladder Cancer Unknown   . Barrett's esophagus Sister   . Breast cancer Maternal Grandmother   . Breast cancer Cousin     Social History Social History  Substance Use Topics  . Smoking status: Former Smoker    Packs/day: 1.00    Years: 25.00    Types: Cigarettes    Quit date: 12/24/1991  . Smokeless tobacco: Never Used  . Alcohol use 4.2 oz/week    7 Glasses of wine per week     Comment: glass of wine every night     Allergies   Other; Penicillins; Demerol [meperidine]; Gold-containing drug products; Medrogestone; Sulfamethoxazole-trimethoprim; Sulfur; Versed [midazolam]; Cefdinir; and Ivp dye [iodinated diagnostic agents]   Review of Systems Review of Systems  Musculoskeletal: Positive for arthralgias, gait problem (Secondary to pain) and joint swelling. Negative for back pain and myalgias.  Skin: Negative for color change, pallor and wound.     Physical Exam Updated Vital Signs BP (!) 178/92 (BP Location: Right Arm)   Pulse 73   Temp 98 F (36.7 C) (Oral)   Resp 18    Ht 5\' 5"  (1.651 m)   Wt 90.7 kg (200 lb)   SpO2 95%   BMI 33.28 kg/m   Physical Exam  Constitutional: She appears well-developed and well-nourished.  HENT:  Head: Normocephalic and atraumatic.  Musculoskeletal:       Right foot: There is tenderness, bony tenderness and swelling. There is no crepitus, no deformity and no laceration.  Pain and swelling over lateral aspect of mid foot.  Sensation and circulatory function is intact to the  foot.    Neurological: No sensory deficit.  Skin: Skin is warm and dry. No rash noted. She is not diaphoretic. No erythema. No pallor.  Psychiatric: She has a normal mood and affect. Her behavior is normal.     ED Treatments / Results  Labs (all labs ordered are listed, but only abnormal results are displayed) Labs Reviewed - No data to display  EKG  EKG Interpretation None       Radiology Dg Foot Complete Right  Result Date: 12/07/2016 CLINICAL DATA:  71 y/o F; status post fall with pain and swelling of the right foot. EXAM: RIGHT FOOT COMPLETE - 3+ VIEW COMPARISON:  None. FINDINGS: Lucency through the base of the left fifth metatarsal bone compatible with nondisplaced fracture. Pes cavus. Hallux valgus and metatarsus adductus varus. Lisfranc alignment is maintained. Small plantar calcaneal enthesophyte. IMPRESSION: 1. Lucency through the base of the left fifth metatarsal bone compatible with acute nondisplaced fracture. 2. Pes cavus.  Hallux valgus and metatarsus adductus varus. 3. Small plantar calcaneal enthesophyte. Electronically Signed   By: Kristine Garbe M.D.   On: 12/07/2016 20:41    Procedures Procedures (including critical care time)  Medications Ordered in ED Medications - No data to display   Initial Impression / Assessment and Plan / ED Course  I have reviewed the triage vital signs and the nursing notes.  Pertinent labs & imaging results that were available during my care of the patient were reviewed by me and  considered in my medical decision making (see chart for details).    Angela Sheppard presents with foot pain with radiographic evidence of a left fifth metatarsal bone nondisplaced fracture. She was given a cam walker and instructions to not put any weight on her foot. She has crutches with her and was instructed on their proper use. She was given a paper prescription for a rolling walker to assist in her mobility at home. Patient was given the option for pain medication, however denied pain medication saying that she revealed to control her pain with ice and elevation. Patient instructed on symptomatically care along with OTC pain medications.  Patient given podiatry follow up for her fracture.  Patient was given the option to ask questions, all of which were answered to the best of my ability.   While in the ED patient was noted to have an elevated blood pressure.  At this time there is nothing to suggest hypertensive urgency or emergency.  Patient was advised to follow up with their PCP  regarding their elevated blood pressure.  Patient hemodynamically stable at this time with no questions and appropriate outpatient follow up.   Final Clinical Impressions(s) / ED Diagnoses   Final diagnoses:  Foot pain, right  Closed fracture of base of fifth metatarsal bone    New Prescriptions New Prescriptions   No medications on file     Ollen Gross 12/07/16 2237    Veryl Speak, MD 12/07/16 2330

## 2016-12-07 NOTE — Discharge Instructions (Signed)
Please keep your foot elevated. Please do not put any weight on the affected foot.    Today your blood pressure was high. This can be caused by pain or distress from the emergency room visit. Please follow-up with your primary care provider regarding your high blood pressure.  Please take Ibuprofen (Advil, motrin) and Tylenol (acetaminophen) to relieve your pain.  You may take up to 800 MG (4 pills) of normal strength ibuprofen every 8 hours as needed.  In between doses of ibuprofen you make take tylenol, up to 1,000 mg (two extra strength pills).  Do not take more than 3,000 mg tylenol in a 24 hour period.  Please check all medication labels as many medications such as pain and cold medications may contain tylenol.  Do not drink while taking these medications.  Do not take other NSAID'S while taking ibuprofen (such as aleve or naproxen).

## 2016-12-09 ENCOUNTER — Encounter: Payer: Self-pay | Admitting: Podiatry

## 2016-12-09 ENCOUNTER — Ambulatory Visit (INDEPENDENT_AMBULATORY_CARE_PROVIDER_SITE_OTHER): Payer: Medicare Other | Admitting: Podiatry

## 2016-12-09 VITALS — BP 128/96 | HR 88 | Resp 16 | Ht 65.0 in | Wt 200.0 lb

## 2016-12-09 DIAGNOSIS — S92301A Fracture of unspecified metatarsal bone(s), right foot, initial encounter for closed fracture: Secondary | ICD-10-CM

## 2016-12-09 DIAGNOSIS — R600 Localized edema: Secondary | ICD-10-CM | POA: Diagnosis not present

## 2016-12-09 NOTE — Progress Notes (Signed)
   Subjective:    Patient ID: Angela Sheppard, female    DOB: December 12, 1945, 71 y.o.   MRN: [id number]  HPI  Chief Complaint  Patient presents with  . Foot Injury    Right foot; pt stated, "Twisted foot when walked dogs in the backyard on Saturday, December 07, 2016 at 6:30 pm; not in any pain"     Review of Systems  HENT: Positive for hearing loss.   Musculoskeletal: Positive for arthralgias and gait problem.  All other systems reviewed and are negative.      Objective:   Physical Exam        Assessment & Plan:

## 2016-12-11 NOTE — Progress Notes (Signed)
Subjective:    Patient ID: Angela Sheppard, female   DOB: 71 y.o.   MRN: [id number]   HPI patient presents stating that she's had a fracture of her right fifth metatarsal and she is sent over by the emergency room and has quite a bit of swelling of her foot    Review of Systems  All other systems reviewed and are negative.       Objective:  Physical Exam  Constitutional: She is oriented to person, place, and time.  Cardiovascular: Intact distal pulses.   Musculoskeletal: Normal range of motion.  Neurological: She is oriented to person, place, and time.  Skin: Skin is warm and dry.  Nursing note and vitals reviewed.  neurovascular status found to be intact with quite a bit of swelling in the right lateral foot with significant edema in the forefoot occurring that is localized in nature with no proximal edema. It is quite tender on the fifth metatarsal base     Assessment:    Probable fracture fifth metatarsal base with edema     Plan:    H&P the x-rays that she brought were reviewed and today I have recommended Unna boot which was applied to try to reduce the swelling along with compression dressing to be followed by ice and continued boot usage for approximate 4 weeks. Should heal uneventfully

## 2016-12-23 DIAGNOSIS — I10 Essential (primary) hypertension: Secondary | ICD-10-CM | POA: Diagnosis not present

## 2016-12-30 DIAGNOSIS — I10 Essential (primary) hypertension: Secondary | ICD-10-CM | POA: Diagnosis not present

## 2017-02-05 DIAGNOSIS — H2512 Age-related nuclear cataract, left eye: Secondary | ICD-10-CM | POA: Diagnosis not present

## 2017-02-05 DIAGNOSIS — H2511 Age-related nuclear cataract, right eye: Secondary | ICD-10-CM | POA: Diagnosis not present

## 2017-02-06 DIAGNOSIS — Z23 Encounter for immunization: Secondary | ICD-10-CM | POA: Diagnosis not present

## 2017-03-12 DIAGNOSIS — H25811 Combined forms of age-related cataract, right eye: Secondary | ICD-10-CM | POA: Diagnosis not present

## 2017-03-12 DIAGNOSIS — H2511 Age-related nuclear cataract, right eye: Secondary | ICD-10-CM | POA: Diagnosis not present

## 2017-03-21 DIAGNOSIS — J4540 Moderate persistent asthma, uncomplicated: Secondary | ICD-10-CM | POA: Diagnosis not present

## 2017-03-21 DIAGNOSIS — H1045 Other chronic allergic conjunctivitis: Secondary | ICD-10-CM | POA: Diagnosis not present

## 2017-03-21 DIAGNOSIS — T783XXD Angioneurotic edema, subsequent encounter: Secondary | ICD-10-CM | POA: Diagnosis not present

## 2017-03-21 DIAGNOSIS — J301 Allergic rhinitis due to pollen: Secondary | ICD-10-CM | POA: Diagnosis not present

## 2017-03-29 DIAGNOSIS — B029 Zoster without complications: Secondary | ICD-10-CM | POA: Diagnosis not present

## 2017-03-29 DIAGNOSIS — H60392 Other infective otitis externa, left ear: Secondary | ICD-10-CM | POA: Diagnosis not present

## 2017-03-31 DIAGNOSIS — I10 Essential (primary) hypertension: Secondary | ICD-10-CM | POA: Diagnosis not present

## 2017-04-02 DIAGNOSIS — Z23 Encounter for immunization: Secondary | ICD-10-CM | POA: Diagnosis not present

## 2017-04-02 DIAGNOSIS — J45909 Unspecified asthma, uncomplicated: Secondary | ICD-10-CM | POA: Diagnosis not present

## 2017-04-02 DIAGNOSIS — E7800 Pure hypercholesterolemia, unspecified: Secondary | ICD-10-CM | POA: Diagnosis not present

## 2017-04-02 DIAGNOSIS — I10 Essential (primary) hypertension: Secondary | ICD-10-CM | POA: Diagnosis not present

## 2017-04-02 DIAGNOSIS — Z0000 Encounter for general adult medical examination without abnormal findings: Secondary | ICD-10-CM | POA: Diagnosis not present

## 2017-04-14 DIAGNOSIS — E871 Hypo-osmolality and hyponatremia: Secondary | ICD-10-CM | POA: Diagnosis not present

## 2017-04-23 DIAGNOSIS — L814 Other melanin hyperpigmentation: Secondary | ICD-10-CM | POA: Diagnosis not present

## 2017-04-23 DIAGNOSIS — D692 Other nonthrombocytopenic purpura: Secondary | ICD-10-CM | POA: Diagnosis not present

## 2017-04-23 DIAGNOSIS — L821 Other seborrheic keratosis: Secondary | ICD-10-CM | POA: Diagnosis not present

## 2017-09-29 DIAGNOSIS — T783XXD Angioneurotic edema, subsequent encounter: Secondary | ICD-10-CM | POA: Diagnosis not present

## 2017-09-29 DIAGNOSIS — J4540 Moderate persistent asthma, uncomplicated: Secondary | ICD-10-CM | POA: Diagnosis not present

## 2017-09-29 DIAGNOSIS — H1045 Other chronic allergic conjunctivitis: Secondary | ICD-10-CM | POA: Diagnosis not present

## 2017-09-29 DIAGNOSIS — J301 Allergic rhinitis due to pollen: Secondary | ICD-10-CM | POA: Diagnosis not present

## 2017-12-23 ENCOUNTER — Other Ambulatory Visit: Payer: Self-pay | Admitting: Family Medicine

## 2017-12-23 DIAGNOSIS — Z1231 Encounter for screening mammogram for malignant neoplasm of breast: Secondary | ICD-10-CM

## 2018-01-15 ENCOUNTER — Ambulatory Visit
Admission: RE | Admit: 2018-01-15 | Discharge: 2018-01-15 | Disposition: A | Discharge status: Home / Self Care | Payer: Medicare Other | Source: Ambulatory Visit | Attending: Family Medicine | Admitting: Family Medicine

## 2018-01-15 DIAGNOSIS — Z1231 Encounter for screening mammogram for malignant neoplasm of breast: Secondary | ICD-10-CM | POA: Diagnosis not present

## 2018-02-16 DIAGNOSIS — Z23 Encounter for immunization: Secondary | ICD-10-CM | POA: Diagnosis not present

## 2018-04-02 DIAGNOSIS — H1045 Other chronic allergic conjunctivitis: Secondary | ICD-10-CM | POA: Diagnosis not present

## 2018-04-02 DIAGNOSIS — J4540 Moderate persistent asthma, uncomplicated: Secondary | ICD-10-CM | POA: Diagnosis not present

## 2018-04-02 DIAGNOSIS — J301 Allergic rhinitis due to pollen: Secondary | ICD-10-CM | POA: Diagnosis not present

## 2018-04-02 DIAGNOSIS — T783XXD Angioneurotic edema, subsequent encounter: Secondary | ICD-10-CM | POA: Diagnosis not present

## 2018-04-27 DIAGNOSIS — H2512 Age-related nuclear cataract, left eye: Secondary | ICD-10-CM | POA: Diagnosis not present

## 2018-05-22 DIAGNOSIS — S20219A Contusion of unspecified front wall of thorax, initial encounter: Secondary | ICD-10-CM | POA: Diagnosis not present

## 2018-07-29 DIAGNOSIS — E7800 Pure hypercholesterolemia, unspecified: Secondary | ICD-10-CM | POA: Diagnosis not present

## 2018-07-29 DIAGNOSIS — J45909 Unspecified asthma, uncomplicated: Secondary | ICD-10-CM | POA: Diagnosis not present

## 2018-07-29 DIAGNOSIS — I10 Essential (primary) hypertension: Secondary | ICD-10-CM | POA: Diagnosis not present

## 2019-01-28 DIAGNOSIS — Z124 Encounter for screening for malignant neoplasm of cervix: Secondary | ICD-10-CM | POA: Diagnosis not present

## 2019-01-28 DIAGNOSIS — Z01419 Encounter for gynecological examination (general) (routine) without abnormal findings: Secondary | ICD-10-CM | POA: Diagnosis not present

## 2019-01-28 DIAGNOSIS — Z1231 Encounter for screening mammogram for malignant neoplasm of breast: Secondary | ICD-10-CM | POA: Diagnosis not present

## 2019-01-29 DIAGNOSIS — Z124 Encounter for screening for malignant neoplasm of cervix: Secondary | ICD-10-CM | POA: Diagnosis not present

## 2019-02-01 DIAGNOSIS — Z0000 Encounter for general adult medical examination without abnormal findings: Secondary | ICD-10-CM | POA: Diagnosis not present

## 2019-02-01 DIAGNOSIS — E7800 Pure hypercholesterolemia, unspecified: Secondary | ICD-10-CM | POA: Diagnosis not present

## 2019-02-01 DIAGNOSIS — J45909 Unspecified asthma, uncomplicated: Secondary | ICD-10-CM | POA: Diagnosis not present

## 2019-02-01 DIAGNOSIS — I10 Essential (primary) hypertension: Secondary | ICD-10-CM | POA: Diagnosis not present

## 2019-02-04 DIAGNOSIS — Z23 Encounter for immunization: Secondary | ICD-10-CM | POA: Diagnosis not present

## 2019-03-05 DIAGNOSIS — M1990 Unspecified osteoarthritis, unspecified site: Secondary | ICD-10-CM | POA: Diagnosis not present

## 2019-03-05 DIAGNOSIS — I10 Essential (primary) hypertension: Secondary | ICD-10-CM | POA: Diagnosis not present

## 2019-03-05 DIAGNOSIS — J45909 Unspecified asthma, uncomplicated: Secondary | ICD-10-CM | POA: Diagnosis not present

## 2019-04-01 DIAGNOSIS — L853 Xerosis cutis: Secondary | ICD-10-CM | POA: Diagnosis not present

## 2019-04-01 DIAGNOSIS — D692 Other nonthrombocytopenic purpura: Secondary | ICD-10-CM | POA: Diagnosis not present

## 2019-04-01 DIAGNOSIS — L578 Other skin changes due to chronic exposure to nonionizing radiation: Secondary | ICD-10-CM | POA: Diagnosis not present

## 2019-04-01 DIAGNOSIS — L821 Other seborrheic keratosis: Secondary | ICD-10-CM | POA: Diagnosis not present

## 2019-04-06 ENCOUNTER — Encounter: Payer: Self-pay | Admitting: Gastroenterology

## 2019-04-22 DIAGNOSIS — H1045 Other chronic allergic conjunctivitis: Secondary | ICD-10-CM | POA: Diagnosis not present

## 2019-04-22 DIAGNOSIS — J301 Allergic rhinitis due to pollen: Secondary | ICD-10-CM | POA: Diagnosis not present

## 2019-04-22 DIAGNOSIS — T783XXD Angioneurotic edema, subsequent encounter: Secondary | ICD-10-CM | POA: Diagnosis not present

## 2019-04-22 DIAGNOSIS — J4540 Moderate persistent asthma, uncomplicated: Secondary | ICD-10-CM | POA: Diagnosis not present

## 2019-04-29 DIAGNOSIS — H2512 Age-related nuclear cataract, left eye: Secondary | ICD-10-CM | POA: Diagnosis not present

## 2019-04-29 DIAGNOSIS — H04123 Dry eye syndrome of bilateral lacrimal glands: Secondary | ICD-10-CM | POA: Diagnosis not present

## 2019-05-07 ENCOUNTER — Encounter: Payer: Self-pay | Admitting: Gastroenterology

## 2019-05-10 ENCOUNTER — Encounter: Payer: Self-pay | Admitting: Gastroenterology

## 2019-06-15 ENCOUNTER — Encounter: Payer: Medicare Other | Admitting: Gastroenterology

## 2019-07-13 ENCOUNTER — Encounter: Payer: Medicare Other | Admitting: Gastroenterology

## 2019-07-14 DIAGNOSIS — I10 Essential (primary) hypertension: Secondary | ICD-10-CM | POA: Diagnosis not present

## 2019-07-14 DIAGNOSIS — M1990 Unspecified osteoarthritis, unspecified site: Secondary | ICD-10-CM | POA: Diagnosis not present

## 2019-07-14 DIAGNOSIS — E7800 Pure hypercholesterolemia, unspecified: Secondary | ICD-10-CM | POA: Diagnosis not present

## 2019-07-14 DIAGNOSIS — J45909 Unspecified asthma, uncomplicated: Secondary | ICD-10-CM | POA: Diagnosis not present

## 2019-07-22 ENCOUNTER — Ambulatory Visit: Payer: Medicare Other

## 2019-07-27 DIAGNOSIS — J45909 Unspecified asthma, uncomplicated: Secondary | ICD-10-CM | POA: Diagnosis not present

## 2019-07-27 DIAGNOSIS — E7800 Pure hypercholesterolemia, unspecified: Secondary | ICD-10-CM | POA: Diagnosis not present

## 2019-07-27 DIAGNOSIS — I10 Essential (primary) hypertension: Secondary | ICD-10-CM | POA: Diagnosis not present

## 2019-07-27 DIAGNOSIS — M1990 Unspecified osteoarthritis, unspecified site: Secondary | ICD-10-CM | POA: Diagnosis not present

## 2019-07-29 ENCOUNTER — Ambulatory Visit: Payer: Medicare Other | Attending: Internal Medicine

## 2019-07-29 DIAGNOSIS — Z23 Encounter for immunization: Secondary | ICD-10-CM | POA: Insufficient documentation

## 2019-07-29 NOTE — Progress Notes (Signed)
   Covid-19 Vaccination Clinic  Name:  Angela Sheppard    MRN: [id number] DOB: [birth year]  07/29/2019  [name] was observed post Covid-19 immunization for 15 minutes without incidence. She was provided with Vaccine Information Sheet and instruction to access the V-Safe system.   [name] was instructed to call 911 with any severe reactions post vaccine: Marland Kitchen Difficulty breathing  . Swelling of your face and throat  . A fast heartbeat  . A bad rash all over your body  . Dizziness and weakness    Immunizations Administered    Name Date Dose VIS Date Route   Pfizer COVID-19 Vaccine 07/29/2019  5:17 PM 0.3 mL 06/04/2019 Intramuscular   Manufacturer: [place]   Lot: [id number]   [place]: [id number]

## 2019-08-08 ENCOUNTER — Ambulatory Visit: Payer: Medicare Other

## 2019-08-24 ENCOUNTER — Ambulatory Visit: Payer: Medicare Other | Attending: Internal Medicine

## 2019-08-24 DIAGNOSIS — Z23 Encounter for immunization: Secondary | ICD-10-CM | POA: Insufficient documentation

## 2019-08-24 NOTE — Progress Notes (Signed)
   Covid-19 Vaccination Clinic  Name:  Angela Sheppard    MRN: [id number] DOB: [birth year]  08/24/2019  [name] was observed post Covid-19 immunization for 15 minutes without incident. She was provided with Vaccine Information Sheet and instruction to access the V-Safe system.   [name] was instructed to call 911 with any severe reactions post vaccine: Marland Kitchen Difficulty breathing  . Swelling of face and throat  . A fast heartbeat  . A bad rash all over body  . Dizziness and weakness   Immunizations Administered    Name Date Dose VIS Date Route   Pfizer COVID-19 Vaccine 08/24/2019  8:07 AM 0.3 mL 06/04/2019 Intramuscular   Manufacturer: [place]   Lot: [id number]   [place]: [id number]

## 2019-08-31 DIAGNOSIS — J45909 Unspecified asthma, uncomplicated: Secondary | ICD-10-CM | POA: Diagnosis not present

## 2019-08-31 DIAGNOSIS — I10 Essential (primary) hypertension: Secondary | ICD-10-CM | POA: Diagnosis not present

## 2019-08-31 DIAGNOSIS — E7800 Pure hypercholesterolemia, unspecified: Secondary | ICD-10-CM | POA: Diagnosis not present

## 2019-08-31 DIAGNOSIS — M1990 Unspecified osteoarthritis, unspecified site: Secondary | ICD-10-CM | POA: Diagnosis not present

## 2019-10-26 DIAGNOSIS — H1045 Other chronic allergic conjunctivitis: Secondary | ICD-10-CM | POA: Diagnosis not present

## 2019-10-26 DIAGNOSIS — J301 Allergic rhinitis due to pollen: Secondary | ICD-10-CM | POA: Diagnosis not present

## 2019-10-26 DIAGNOSIS — T783XXD Angioneurotic edema, subsequent encounter: Secondary | ICD-10-CM | POA: Diagnosis not present

## 2019-10-26 DIAGNOSIS — J4540 Moderate persistent asthma, uncomplicated: Secondary | ICD-10-CM | POA: Diagnosis not present

## 2019-11-24 ENCOUNTER — Encounter: Payer: Self-pay | Admitting: Gastroenterology

## 2019-12-03 DIAGNOSIS — M1990 Unspecified osteoarthritis, unspecified site: Secondary | ICD-10-CM | POA: Diagnosis not present

## 2019-12-03 DIAGNOSIS — E7800 Pure hypercholesterolemia, unspecified: Secondary | ICD-10-CM | POA: Diagnosis not present

## 2019-12-03 DIAGNOSIS — J45909 Unspecified asthma, uncomplicated: Secondary | ICD-10-CM | POA: Diagnosis not present

## 2019-12-03 DIAGNOSIS — I10 Essential (primary) hypertension: Secondary | ICD-10-CM | POA: Diagnosis not present

## 2020-01-12 ENCOUNTER — Other Ambulatory Visit: Payer: Self-pay

## 2020-01-12 ENCOUNTER — Ambulatory Visit (AMBULATORY_SURGERY_CENTER): Payer: Self-pay | Admitting: *Deleted

## 2020-01-12 VITALS — Ht 65.0 in | Wt 208.0 lb

## 2020-01-12 DIAGNOSIS — Z86010 Personal history of colonic polyps: Secondary | ICD-10-CM

## 2020-01-12 NOTE — Progress Notes (Signed)
No egg or soy allergy known to patient  No issues with past sedation with any surgeries or procedures no intubation problems in the past  No diet pills per patient No home 02 use per patient  No blood thinners per patient  Pt denies issues with constipation  No A fib or A flutter  EMMI video to pt or MyChart  COVID 19 guidelines implemented in [facility] today   Due to the COVID-19 pandemic we are asking patients to follow these guidelines. Please only bring one care partner. Please be aware that your care partner may wait in the car in the parking lot or if they feel like they will be too hot to wait in the car, they may wait in the lobby on the 4th floor. All care partners are required to wear a mask the entire time (we do not have any that we can provide them), they need to practice social distancing, and we will do a Covid check for all patient's and care partners when you arrive. Also we will check their temperature and your temperature. If the care partner waits in their car they need to stay in the parking lot the entire time and we will call them on their cell phone when the patient is ready for discharge so they can bring the car to the front of the building. Also all patient's will need to wear a mask into building.  

## 2020-01-25 DIAGNOSIS — I10 Essential (primary) hypertension: Secondary | ICD-10-CM | POA: Diagnosis not present

## 2020-01-25 DIAGNOSIS — M1990 Unspecified osteoarthritis, unspecified site: Secondary | ICD-10-CM | POA: Diagnosis not present

## 2020-01-25 DIAGNOSIS — J45909 Unspecified asthma, uncomplicated: Secondary | ICD-10-CM | POA: Diagnosis not present

## 2020-01-25 DIAGNOSIS — E7800 Pure hypercholesterolemia, unspecified: Secondary | ICD-10-CM | POA: Diagnosis not present

## 2020-01-26 ENCOUNTER — Encounter: Payer: Self-pay | Admitting: Gastroenterology

## 2020-01-26 ENCOUNTER — Other Ambulatory Visit: Payer: Self-pay

## 2020-01-26 ENCOUNTER — Ambulatory Visit (AMBULATORY_SURGERY_CENTER): Payer: Medicare Other | Admitting: Gastroenterology

## 2020-01-26 VITALS — BP 126/66 | HR 82 | Temp 97.1°F | Resp 16 | Ht 65.0 in | Wt 208.0 lb

## 2020-01-26 DIAGNOSIS — D128 Benign neoplasm of rectum: Secondary | ICD-10-CM

## 2020-01-26 DIAGNOSIS — D129 Benign neoplasm of anus and anal canal: Secondary | ICD-10-CM

## 2020-01-26 DIAGNOSIS — D127 Benign neoplasm of rectosigmoid junction: Secondary | ICD-10-CM | POA: Diagnosis not present

## 2020-01-26 DIAGNOSIS — D125 Benign neoplasm of sigmoid colon: Secondary | ICD-10-CM

## 2020-01-26 DIAGNOSIS — Z86010 Personal history of colonic polyps: Secondary | ICD-10-CM | POA: Diagnosis not present

## 2020-01-26 DIAGNOSIS — D123 Benign neoplasm of transverse colon: Secondary | ICD-10-CM

## 2020-01-26 DIAGNOSIS — Z1211 Encounter for screening for malignant neoplasm of colon: Secondary | ICD-10-CM | POA: Diagnosis not present

## 2020-01-26 MED ORDER — SODIUM CHLORIDE 0.9 % IV SOLN: 500.0000 mL | Freq: Once | INTRAVENOUS | Status: DC

## 2020-01-26 NOTE — Progress Notes (Signed)
Called to room to assist during endoscopic procedure.  Patient ID and intended procedure confirmed with present staff. Received instructions for my participation in the procedure from the performing physician.  

## 2020-01-26 NOTE — Op Note (Signed)
[facility] Patient Name: Angela Sheppard Procedure Date: 01/26/2020 7:58 AM MRN: [id number] Endoscopist: Ladene Artist , MD Age: 75 Referring MD:  Date of Birth: 1946-05-24 Gender: Female Account #: [id number] Procedure:                Colonoscopy Indications:              Surveillance: Personal history of adenomatous                            polyps on last colonoscopy > 5 years ago Medicines:                Monitored Anesthesia Care Procedure:                Pre-Anesthesia Assessment:                           - Prior to the procedure, a History and Physical                            was performed, and patient medications and                            allergies were reviewed. The patient's tolerance of                            previous anesthesia was also reviewed. The risks                            and benefits of the procedure and the sedation                            options and risks were discussed with the patient.                            All questions were answered, and informed consent                            was obtained. Prior Anticoagulants: The patient has                            taken no previous anticoagulant or antiplatelet                            agents. ASA Grade Assessment: III - A patient with                            severe systemic disease. After reviewing the risks                            and benefits, the patient was deemed in                            satisfactory condition to undergo the procedure.  After obtaining informed consent, the colonoscope                            was passed under direct vision. Throughout the                            procedure, the patient's blood pressure, pulse, and                            oxygen saturations were monitored continuously. The                            Colonoscope was introduced through the anus and                            advanced to the the  cecum, identified by                            appendiceal orifice and ileocecal valve. The                            ileocecal valve, appendiceal orifice, and rectum                            were photographed. The quality of the bowel                            preparation was good. The colonoscopy was performed                            without difficulty. The patient tolerated the                            procedure well. Scope In: 8:07:32 AM Scope Out: 8:28:44 AM Scope Withdrawal Time: 0 hours 16 minutes 49 seconds  Total Procedure Duration: 0 hours 21 minutes 12 seconds  Findings:                 The perianal and digital rectal examinations were                            normal.                           Four sessile polyps were found in the rectum (2),                            sigmoid colon (1) and transverse colon (1). The                            polyps were 6 to 7 mm in size. These polyps were                            removed with a cold snare. Resection and retrieval  were complete.                           Multiple medium-mouthed diverticula were found in                            the left colon. There was no evidence of                            diverticular bleeding.                           The exam was otherwise without abnormality on                            direct and retroflexion views. Complications:            No immediate complications. Estimated blood loss:                            None. Estimated Blood Loss:     Estimated blood loss: none. Impression:               - Four 6 to 7 mm polyps in the rectum, in the                            sigmoid colon and in the transverse colon, removed                            with a cold snare. Resected and retrieved.                           - Mild diverticulosis in the left colon.                           - The examination was otherwise normal on direct                             and retroflexion views. Recommendation:           - Repeat colonoscopy after studies are complete for                            surveillance based on pathology results.                           - Patient has a contact number available for                            emergencies. The signs and symptoms of potential                            delayed complications were discussed with the                            patient. Return to normal activities tomorrow.  Written discharge instructions were provided to the                            patient.                           - High fiber diet.                           - Continue present medications.                           - Await pathology results. Ladene Artist, MD 01/26/2020 8:31:42 AM This report has been signed electronically.

## 2020-01-26 NOTE — Progress Notes (Signed)
To PACU, VSS. Report to [email or web address] 

## 2020-01-26 NOTE — Progress Notes (Signed)
Pt's states no medical or surgical changes since previsit or office visit. VS by WR.

## 2020-01-26 NOTE — Patient Instructions (Addendum)
Handouts given:  Polyps, diverticulosis, High fiber diet Start high fiber diet  continue present medications  await pathology results    YOU HAD AN ENDOSCOPIC PROCEDURE TODAY AT [facility]:   Refer to the procedure report that was given to you for any specific questions about what was found during the examination.  If the procedure report does not answer your questions, please call your gastroenterologist to clarify.  If you requested that your care partner not be given the details of your procedure findings, then the procedure report has been included in a sealed envelope for you to review at your convenience later.  YOU SHOULD EXPECT: Some feelings of bloating in the abdomen. Passage of more gas than usual.  Walking can help get rid of the air that was put into your GI tract during the procedure and reduce the bloating. If you had a lower endoscopy (such as a colonoscopy or flexible sigmoidoscopy) you may notice spotting of blood in your stool or on the toilet paper. If you underwent a bowel prep for your procedure, you may not have a normal bowel movement for a few days.  Please Note:  You might notice some irritation and congestion in your nose or some drainage.  This is from the oxygen used during your procedure.  There is no need for concern and it should clear up in a day or so.  SYMPTOMS TO REPORT IMMEDIATELY:   Following lower endoscopy (colonoscopy or flexible sigmoidoscopy):  Excessive amounts of blood in the stool  Significant tenderness or worsening of abdominal pains  Swelling of the abdomen that is new, acute  Fever of 100F or higher   For urgent or emergent issues, a gastroenterologist can be reached at any hour by calling [phone]. Do not use MyChart messaging for urgent concerns.    DIET:  We do recommend a small meal at first, but then you may proceed to your regular diet.  Drink plenty of fluids but you should avoid alcoholic beverages for 24  hours.  ACTIVITY:  You should plan to take it easy for the rest of today and you should NOT DRIVE or use heavy machinery until tomorrow (because of the sedation medicines used during the test).    FOLLOW UP: Our staff will call the number listed on your records 48-72 hours following your procedure to check on you and address any questions or concerns that you may have regarding the information given to you following your procedure. If we do not reach you, we will leave a message.  We will attempt to reach you two times.  During this call, we will ask if you have developed any symptoms of COVID 19. If you develop any symptoms (ie: fever, flu-like symptoms, shortness of breath, cough etc.) before then, please call [phone].  If you test positive for Covid 19 in the 2 weeks post procedure, please call and report this information to [place].    If any biopsies were taken you will be contacted by phone or by letter within the next 1-3 weeks.  Please call us at [phone] if you have not heard about the biopsies in 3 weeks.    SIGNATURES/CONFIDENTIALITY: You and/or your care partner have signed paperwork which will be entered into your electronic medical record.  These signatures attest to the fact that that the information above on your After Visit Summary has been reviewed and is understood.  Full responsibility of the confidentiality of this discharge information lies  with you and/or your care-partner.

## 2020-01-28 ENCOUNTER — Telehealth: Payer: Self-pay | Admitting: *Deleted

## 2020-01-28 NOTE — Telephone Encounter (Signed)
  Follow up Call-  Call back number 01/26/2020  Post procedure Call Back phone  # [phone]  Permission to leave phone message Yes  Some recent data might be hidden     Patient questions:  Do you have a fever, pain , or abdominal swelling? No. Pain Score  0 *  Have you tolerated food without any problems? Yes.    Have you been able to return to your normal activities? Yes.    Do you have any questions about your discharge instructions: Diet   No. Medications  No. Follow up visit  No.  Do you have questions or concerns about your Care? No.  Actions: * If pain score is 4 or above: 1. No action needed, pain <4.Have you developed a fever since your procedure? no  2.   Have you had an respiratory symptoms (SOB or cough) since your procedure? no  3.   Have you tested positive for COVID 19 since your procedure no  4.   Have you had any family members/close contacts diagnosed with the COVID 19 since your procedure?  no   If yes to any of these questions please route to Joylene John, RN and Erenest Rasher, RN

## 2020-02-02 DIAGNOSIS — I10 Essential (primary) hypertension: Secondary | ICD-10-CM | POA: Diagnosis not present

## 2020-02-02 DIAGNOSIS — J45909 Unspecified asthma, uncomplicated: Secondary | ICD-10-CM | POA: Diagnosis not present

## 2020-02-02 DIAGNOSIS — E7800 Pure hypercholesterolemia, unspecified: Secondary | ICD-10-CM | POA: Diagnosis not present

## 2020-02-02 DIAGNOSIS — Z0000 Encounter for general adult medical examination without abnormal findings: Secondary | ICD-10-CM | POA: Diagnosis not present

## 2020-02-10 ENCOUNTER — Encounter: Payer: Self-pay | Admitting: Gastroenterology

## 2020-03-05 DIAGNOSIS — Z23 Encounter for immunization: Secondary | ICD-10-CM | POA: Diagnosis not present

## 2020-04-02 DIAGNOSIS — Z23 Encounter for immunization: Secondary | ICD-10-CM | POA: Diagnosis not present

## 2020-04-13 DIAGNOSIS — M1990 Unspecified osteoarthritis, unspecified site: Secondary | ICD-10-CM | POA: Diagnosis not present

## 2020-04-13 DIAGNOSIS — I10 Essential (primary) hypertension: Secondary | ICD-10-CM | POA: Diagnosis not present

## 2020-04-13 DIAGNOSIS — E7800 Pure hypercholesterolemia, unspecified: Secondary | ICD-10-CM | POA: Diagnosis not present

## 2020-04-13 DIAGNOSIS — J45909 Unspecified asthma, uncomplicated: Secondary | ICD-10-CM | POA: Diagnosis not present

## 2020-04-18 ENCOUNTER — Other Ambulatory Visit: Payer: Self-pay | Admitting: Family Medicine

## 2020-04-18 DIAGNOSIS — Z1231 Encounter for screening mammogram for malignant neoplasm of breast: Secondary | ICD-10-CM

## 2020-05-24 ENCOUNTER — Ambulatory Visit
Admission: RE | Admit: 2020-05-24 | Discharge: 2020-05-24 | Disposition: A | Discharge status: Home / Self Care | Payer: Medicare Other | Source: Ambulatory Visit | Attending: Family Medicine | Admitting: Family Medicine

## 2020-05-24 ENCOUNTER — Other Ambulatory Visit: Payer: Self-pay

## 2020-05-24 DIAGNOSIS — Z1231 Encounter for screening mammogram for malignant neoplasm of breast: Secondary | ICD-10-CM | POA: Diagnosis not present

## 2020-05-30 DIAGNOSIS — T783XXD Angioneurotic edema, subsequent encounter: Secondary | ICD-10-CM | POA: Diagnosis not present

## 2020-05-30 DIAGNOSIS — J301 Allergic rhinitis due to pollen: Secondary | ICD-10-CM | POA: Diagnosis not present

## 2020-05-30 DIAGNOSIS — J4540 Moderate persistent asthma, uncomplicated: Secondary | ICD-10-CM | POA: Diagnosis not present

## 2020-05-30 DIAGNOSIS — H1045 Other chronic allergic conjunctivitis: Secondary | ICD-10-CM | POA: Diagnosis not present

## 2020-06-27 DIAGNOSIS — E7800 Pure hypercholesterolemia, unspecified: Secondary | ICD-10-CM | POA: Diagnosis not present

## 2020-06-27 DIAGNOSIS — E782 Mixed hyperlipidemia: Secondary | ICD-10-CM | POA: Diagnosis not present

## 2020-06-27 DIAGNOSIS — J4540 Moderate persistent asthma, uncomplicated: Secondary | ICD-10-CM | POA: Diagnosis not present

## 2020-06-27 DIAGNOSIS — I10 Essential (primary) hypertension: Secondary | ICD-10-CM | POA: Diagnosis not present

## 2020-06-27 DIAGNOSIS — E785 Hyperlipidemia, unspecified: Secondary | ICD-10-CM | POA: Diagnosis not present

## 2020-06-27 DIAGNOSIS — K219 Gastro-esophageal reflux disease without esophagitis: Secondary | ICD-10-CM | POA: Diagnosis not present

## 2020-06-27 DIAGNOSIS — J45909 Unspecified asthma, uncomplicated: Secondary | ICD-10-CM | POA: Diagnosis not present

## 2020-06-27 DIAGNOSIS — M1990 Unspecified osteoarthritis, unspecified site: Secondary | ICD-10-CM | POA: Diagnosis not present

## 2020-09-06 DIAGNOSIS — K219 Gastro-esophageal reflux disease without esophagitis: Secondary | ICD-10-CM | POA: Diagnosis not present

## 2020-09-06 DIAGNOSIS — J4540 Moderate persistent asthma, uncomplicated: Secondary | ICD-10-CM | POA: Diagnosis not present

## 2020-09-06 DIAGNOSIS — E785 Hyperlipidemia, unspecified: Secondary | ICD-10-CM | POA: Diagnosis not present

## 2020-09-06 DIAGNOSIS — E7800 Pure hypercholesterolemia, unspecified: Secondary | ICD-10-CM | POA: Diagnosis not present

## 2020-09-06 DIAGNOSIS — M1990 Unspecified osteoarthritis, unspecified site: Secondary | ICD-10-CM | POA: Diagnosis not present

## 2020-09-06 DIAGNOSIS — I10 Essential (primary) hypertension: Secondary | ICD-10-CM | POA: Diagnosis not present

## 2020-09-06 DIAGNOSIS — J45909 Unspecified asthma, uncomplicated: Secondary | ICD-10-CM | POA: Diagnosis not present

## 2020-10-17 DIAGNOSIS — Z23 Encounter for immunization: Secondary | ICD-10-CM | POA: Diagnosis not present

## 2020-11-17 DIAGNOSIS — J4540 Moderate persistent asthma, uncomplicated: Secondary | ICD-10-CM | POA: Diagnosis not present

## 2020-11-17 DIAGNOSIS — E782 Mixed hyperlipidemia: Secondary | ICD-10-CM | POA: Diagnosis not present

## 2020-11-17 DIAGNOSIS — K219 Gastro-esophageal reflux disease without esophagitis: Secondary | ICD-10-CM | POA: Diagnosis not present

## 2020-11-17 DIAGNOSIS — J45909 Unspecified asthma, uncomplicated: Secondary | ICD-10-CM | POA: Diagnosis not present

## 2020-11-17 DIAGNOSIS — I10 Essential (primary) hypertension: Secondary | ICD-10-CM | POA: Diagnosis not present

## 2020-11-17 DIAGNOSIS — M1990 Unspecified osteoarthritis, unspecified site: Secondary | ICD-10-CM | POA: Diagnosis not present

## 2020-11-17 DIAGNOSIS — E7800 Pure hypercholesterolemia, unspecified: Secondary | ICD-10-CM | POA: Diagnosis not present

## 2020-12-19 DIAGNOSIS — J4540 Moderate persistent asthma, uncomplicated: Secondary | ICD-10-CM | POA: Diagnosis not present

## 2020-12-19 DIAGNOSIS — K219 Gastro-esophageal reflux disease without esophagitis: Secondary | ICD-10-CM | POA: Diagnosis not present

## 2020-12-19 DIAGNOSIS — E782 Mixed hyperlipidemia: Secondary | ICD-10-CM | POA: Diagnosis not present

## 2020-12-19 DIAGNOSIS — I10 Essential (primary) hypertension: Secondary | ICD-10-CM | POA: Diagnosis not present

## 2020-12-19 DIAGNOSIS — M1990 Unspecified osteoarthritis, unspecified site: Secondary | ICD-10-CM | POA: Diagnosis not present

## 2020-12-19 DIAGNOSIS — J45909 Unspecified asthma, uncomplicated: Secondary | ICD-10-CM | POA: Diagnosis not present

## 2020-12-19 DIAGNOSIS — E785 Hyperlipidemia, unspecified: Secondary | ICD-10-CM | POA: Diagnosis not present

## 2020-12-25 DIAGNOSIS — Z20822 Contact with and (suspected) exposure to covid-19: Secondary | ICD-10-CM | POA: Diagnosis not present

## 2020-12-27 DIAGNOSIS — J301 Allergic rhinitis due to pollen: Secondary | ICD-10-CM | POA: Diagnosis not present

## 2020-12-27 DIAGNOSIS — T783XXD Angioneurotic edema, subsequent encounter: Secondary | ICD-10-CM | POA: Diagnosis not present

## 2020-12-27 DIAGNOSIS — H1045 Other chronic allergic conjunctivitis: Secondary | ICD-10-CM | POA: Diagnosis not present

## 2020-12-27 DIAGNOSIS — J4540 Moderate persistent asthma, uncomplicated: Secondary | ICD-10-CM | POA: Diagnosis not present

## 2021-01-23 DIAGNOSIS — Z20822 Contact with and (suspected) exposure to covid-19: Secondary | ICD-10-CM | POA: Diagnosis not present

## 2021-02-06 DIAGNOSIS — Z1389 Encounter for screening for other disorder: Secondary | ICD-10-CM | POA: Diagnosis not present

## 2021-02-06 DIAGNOSIS — Z0000 Encounter for general adult medical examination without abnormal findings: Secondary | ICD-10-CM | POA: Diagnosis not present

## 2021-02-07 DIAGNOSIS — E785 Hyperlipidemia, unspecified: Secondary | ICD-10-CM | POA: Diagnosis not present

## 2021-02-07 DIAGNOSIS — R5383 Other fatigue: Secondary | ICD-10-CM | POA: Diagnosis not present

## 2021-02-07 DIAGNOSIS — I10 Essential (primary) hypertension: Secondary | ICD-10-CM | POA: Diagnosis not present

## 2021-02-07 DIAGNOSIS — E7800 Pure hypercholesterolemia, unspecified: Secondary | ICD-10-CM | POA: Diagnosis not present

## 2021-02-22 DIAGNOSIS — Z23 Encounter for immunization: Secondary | ICD-10-CM | POA: Diagnosis not present

## 2021-03-01 DIAGNOSIS — J4540 Moderate persistent asthma, uncomplicated: Secondary | ICD-10-CM | POA: Diagnosis not present

## 2021-03-01 DIAGNOSIS — I10 Essential (primary) hypertension: Secondary | ICD-10-CM | POA: Diagnosis not present

## 2021-03-01 DIAGNOSIS — E785 Hyperlipidemia, unspecified: Secondary | ICD-10-CM | POA: Diagnosis not present

## 2021-03-01 DIAGNOSIS — M1990 Unspecified osteoarthritis, unspecified site: Secondary | ICD-10-CM | POA: Diagnosis not present

## 2021-03-01 DIAGNOSIS — E7800 Pure hypercholesterolemia, unspecified: Secondary | ICD-10-CM | POA: Diagnosis not present

## 2021-03-01 DIAGNOSIS — K219 Gastro-esophageal reflux disease without esophagitis: Secondary | ICD-10-CM | POA: Diagnosis not present

## 2021-03-01 DIAGNOSIS — J45909 Unspecified asthma, uncomplicated: Secondary | ICD-10-CM | POA: Diagnosis not present

## 2021-03-07 DIAGNOSIS — Z23 Encounter for immunization: Secondary | ICD-10-CM | POA: Diagnosis not present

## 2021-04-11 ENCOUNTER — Other Ambulatory Visit: Payer: Self-pay | Admitting: Family Medicine

## 2021-04-11 DIAGNOSIS — Z1231 Encounter for screening mammogram for malignant neoplasm of breast: Secondary | ICD-10-CM

## 2021-05-25 ENCOUNTER — Ambulatory Visit
Admission: RE | Admit: 2021-05-25 | Discharge: 2021-05-25 | Disposition: A | Discharge status: Home / Self Care | Payer: Medicare Other | Source: Ambulatory Visit | Attending: Family Medicine | Admitting: Family Medicine

## 2021-05-25 DIAGNOSIS — Z1231 Encounter for screening mammogram for malignant neoplasm of breast: Secondary | ICD-10-CM | POA: Diagnosis not present

## 2021-05-29 DIAGNOSIS — Z20822 Contact with and (suspected) exposure to covid-19: Secondary | ICD-10-CM | POA: Diagnosis not present

## 2021-08-08 DIAGNOSIS — J4540 Moderate persistent asthma, uncomplicated: Secondary | ICD-10-CM | POA: Diagnosis not present

## 2021-08-08 DIAGNOSIS — J301 Allergic rhinitis due to pollen: Secondary | ICD-10-CM | POA: Diagnosis not present

## 2021-08-08 DIAGNOSIS — H1045 Other chronic allergic conjunctivitis: Secondary | ICD-10-CM | POA: Diagnosis not present

## 2021-08-08 DIAGNOSIS — T783XXD Angioneurotic edema, subsequent encounter: Secondary | ICD-10-CM | POA: Diagnosis not present

## 2021-09-12 DIAGNOSIS — Z20822 Contact with and (suspected) exposure to covid-19: Secondary | ICD-10-CM | POA: Diagnosis not present

## 2021-10-15 DIAGNOSIS — Z20822 Contact with and (suspected) exposure to covid-19: Secondary | ICD-10-CM | POA: Diagnosis not present

## 2021-10-29 DIAGNOSIS — Z20822 Contact with and (suspected) exposure to covid-19: Secondary | ICD-10-CM | POA: Diagnosis not present

## 2022-02-07 DIAGNOSIS — Z1389 Encounter for screening for other disorder: Secondary | ICD-10-CM | POA: Diagnosis not present

## 2022-02-07 DIAGNOSIS — Z0000 Encounter for general adult medical examination without abnormal findings: Secondary | ICD-10-CM | POA: Diagnosis not present

## 2022-02-27 DIAGNOSIS — R051 Acute cough: Secondary | ICD-10-CM | POA: Diagnosis not present

## 2022-03-08 DIAGNOSIS — J4540 Moderate persistent asthma, uncomplicated: Secondary | ICD-10-CM | POA: Diagnosis not present

## 2022-03-08 DIAGNOSIS — J45909 Unspecified asthma, uncomplicated: Secondary | ICD-10-CM | POA: Diagnosis not present

## 2022-03-08 DIAGNOSIS — E782 Mixed hyperlipidemia: Secondary | ICD-10-CM | POA: Diagnosis not present

## 2022-03-08 DIAGNOSIS — Z23 Encounter for immunization: Secondary | ICD-10-CM | POA: Diagnosis not present

## 2022-03-08 DIAGNOSIS — Z6834 Body mass index (BMI) 34.0-34.9, adult: Secondary | ICD-10-CM | POA: Diagnosis not present

## 2022-03-08 DIAGNOSIS — I10 Essential (primary) hypertension: Secondary | ICD-10-CM | POA: Diagnosis not present

## 2022-03-13 DIAGNOSIS — T783XXD Angioneurotic edema, subsequent encounter: Secondary | ICD-10-CM | POA: Diagnosis not present

## 2022-03-13 DIAGNOSIS — J301 Allergic rhinitis due to pollen: Secondary | ICD-10-CM | POA: Diagnosis not present

## 2022-03-13 DIAGNOSIS — H1045 Other chronic allergic conjunctivitis: Secondary | ICD-10-CM | POA: Diagnosis not present

## 2022-03-13 DIAGNOSIS — J4540 Moderate persistent asthma, uncomplicated: Secondary | ICD-10-CM | POA: Diagnosis not present

## 2022-03-28 DIAGNOSIS — Z23 Encounter for immunization: Secondary | ICD-10-CM | POA: Diagnosis not present

## 2022-04-24 ENCOUNTER — Other Ambulatory Visit: Payer: Self-pay | Admitting: Family Medicine

## 2022-04-24 DIAGNOSIS — Z1231 Encounter for screening mammogram for malignant neoplasm of breast: Secondary | ICD-10-CM

## 2022-06-25 ENCOUNTER — Ambulatory Visit: Payer: Medicare Other

## 2022-08-13 ENCOUNTER — Ambulatory Visit
Admission: RE | Admit: 2022-08-13 | Discharge: 2022-08-13 | Disposition: A | Discharge status: Home / Self Care | Payer: Medicare Other | Source: Ambulatory Visit | Attending: Family Medicine | Admitting: Family Medicine

## 2022-08-13 DIAGNOSIS — Z1231 Encounter for screening mammogram for malignant neoplasm of breast: Secondary | ICD-10-CM

## 2022-08-29 DIAGNOSIS — J4540 Moderate persistent asthma, uncomplicated: Secondary | ICD-10-CM | POA: Diagnosis not present

## 2022-08-29 DIAGNOSIS — I10 Essential (primary) hypertension: Secondary | ICD-10-CM | POA: Diagnosis not present

## 2022-08-29 DIAGNOSIS — E782 Mixed hyperlipidemia: Secondary | ICD-10-CM | POA: Diagnosis not present

## 2022-09-19 DIAGNOSIS — Z23 Encounter for immunization: Secondary | ICD-10-CM | POA: Diagnosis not present

## 2022-12-11 DIAGNOSIS — T783XXD Angioneurotic edema, subsequent encounter: Secondary | ICD-10-CM | POA: Diagnosis not present

## 2022-12-11 DIAGNOSIS — J4540 Moderate persistent asthma, uncomplicated: Secondary | ICD-10-CM | POA: Diagnosis not present

## 2022-12-11 DIAGNOSIS — H1045 Other chronic allergic conjunctivitis: Secondary | ICD-10-CM | POA: Diagnosis not present

## 2022-12-11 DIAGNOSIS — J301 Allergic rhinitis due to pollen: Secondary | ICD-10-CM | POA: Diagnosis not present

## 2022-12-31 DIAGNOSIS — H2513 Age-related nuclear cataract, bilateral: Secondary | ICD-10-CM | POA: Diagnosis not present

## 2023-02-05 DIAGNOSIS — Z23 Encounter for immunization: Secondary | ICD-10-CM | POA: Diagnosis not present

## 2023-03-10 DIAGNOSIS — Z23 Encounter for immunization: Secondary | ICD-10-CM | POA: Diagnosis not present

## 2023-06-04 DIAGNOSIS — K219 Gastro-esophageal reflux disease without esophagitis: Secondary | ICD-10-CM | POA: Diagnosis not present

## 2023-06-04 DIAGNOSIS — I10 Essential (primary) hypertension: Secondary | ICD-10-CM | POA: Diagnosis not present

## 2023-06-04 DIAGNOSIS — E782 Mixed hyperlipidemia: Secondary | ICD-10-CM | POA: Diagnosis not present

## 2023-06-30 ENCOUNTER — Other Ambulatory Visit: Payer: Self-pay | Admitting: Family Medicine

## 2023-06-30 DIAGNOSIS — Z1231 Encounter for screening mammogram for malignant neoplasm of breast: Secondary | ICD-10-CM

## 2023-08-11 ENCOUNTER — Other Ambulatory Visit: Payer: Self-pay | Admitting: Family Medicine

## 2023-08-11 DIAGNOSIS — Z1231 Encounter for screening mammogram for malignant neoplasm of breast: Secondary | ICD-10-CM

## 2023-08-13 IMAGING — MG MM DIGITAL SCREENING BILAT W/ TOMO AND CAD
6 of 12 series · 6 of 36 positions shown · non-contrast
Comparison: Previous exam(s).

CLINICAL DATA: Screening.

EXAM:
DIGITAL SCREENING BILATERAL MAMMOGRAM WITH TOMOSYNTHESIS AND CAD
TECHNIQUE: Bilateral screening digital craniocaudal and mediolateral oblique
mammograms were obtained. Bilateral screening digital breast
tomosynthesis was performed. The images were evaluated with
computer-aided detection.

[L CC synth-2D (1 of 2)]
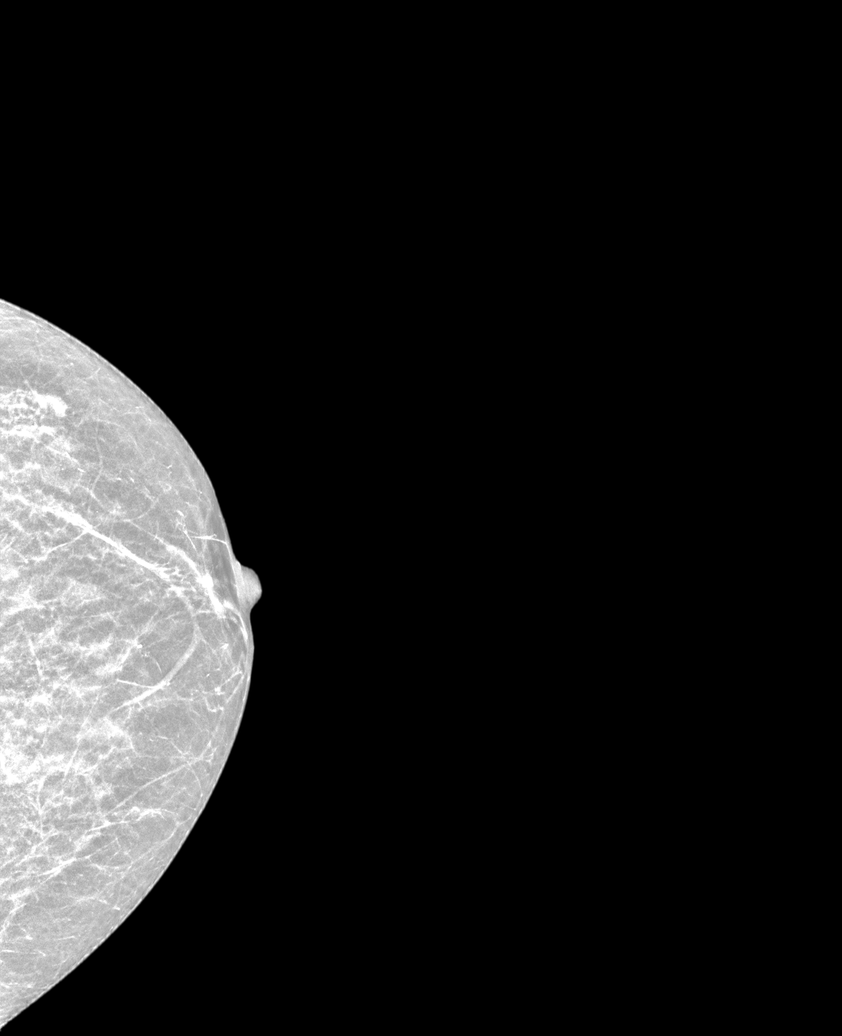

[R MLO synth-2D]
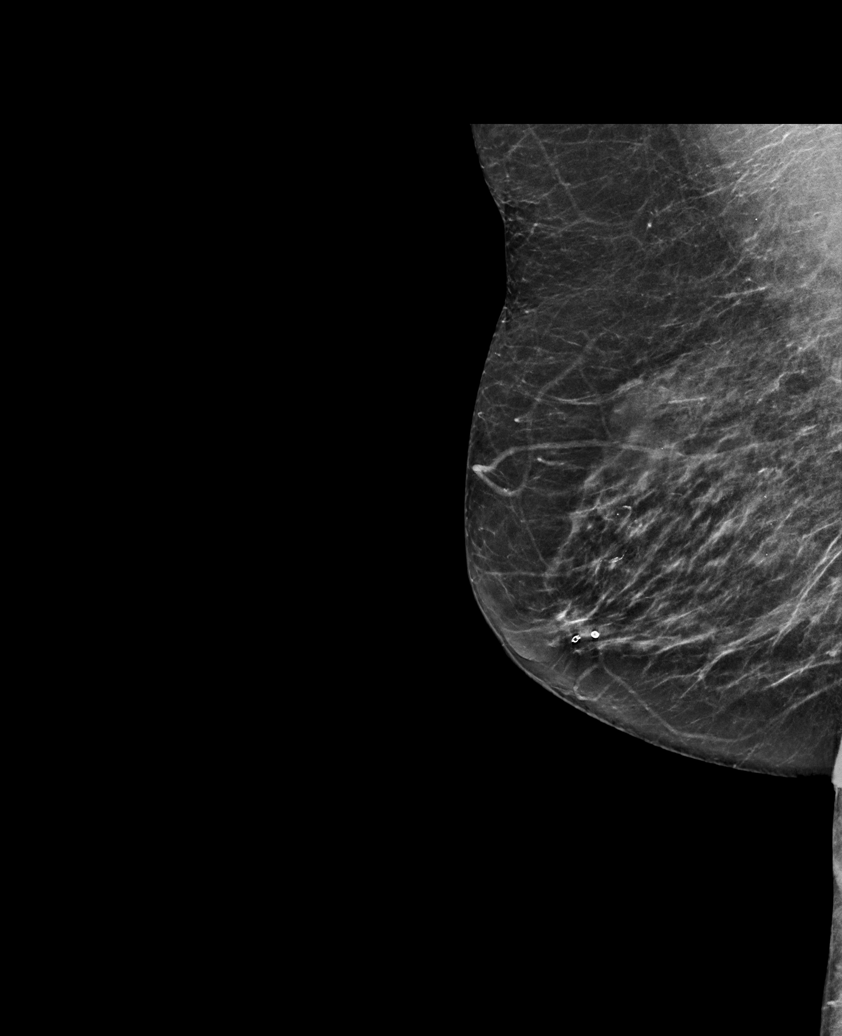

[R CC synth-2D (1 of 2)]
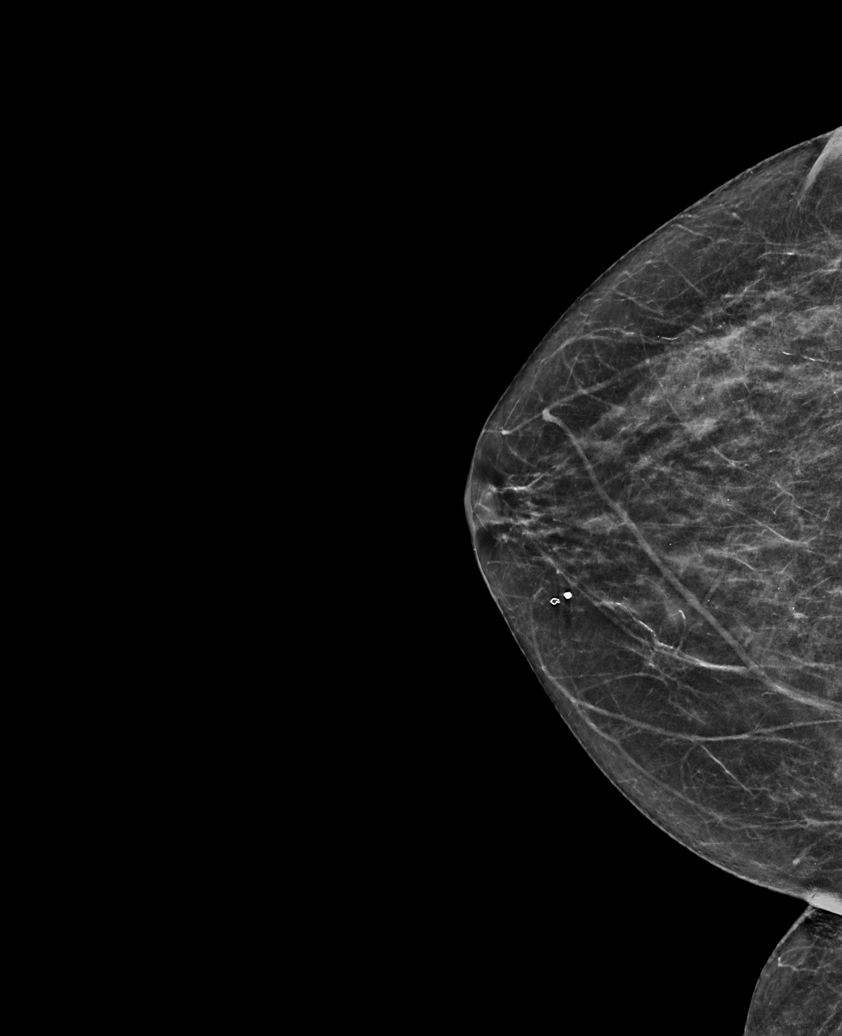

[R CC synth-2D (2 of 2)]
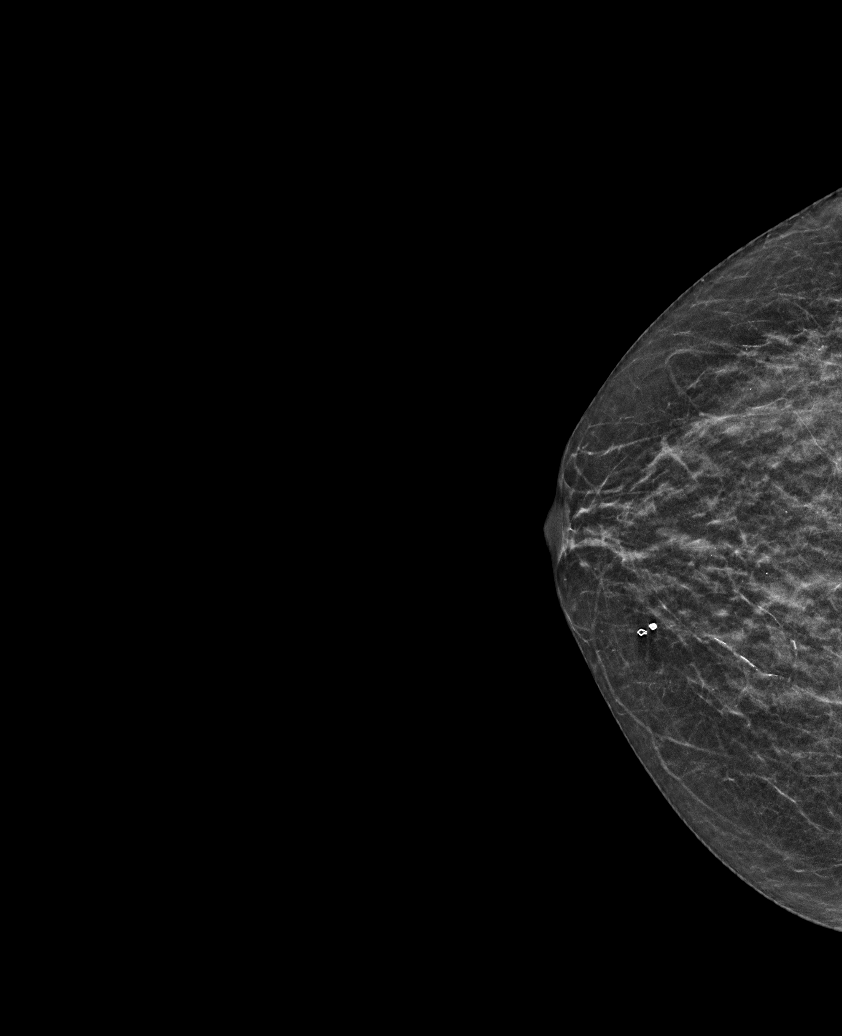

[L CC synth-2D (2 of 2)]
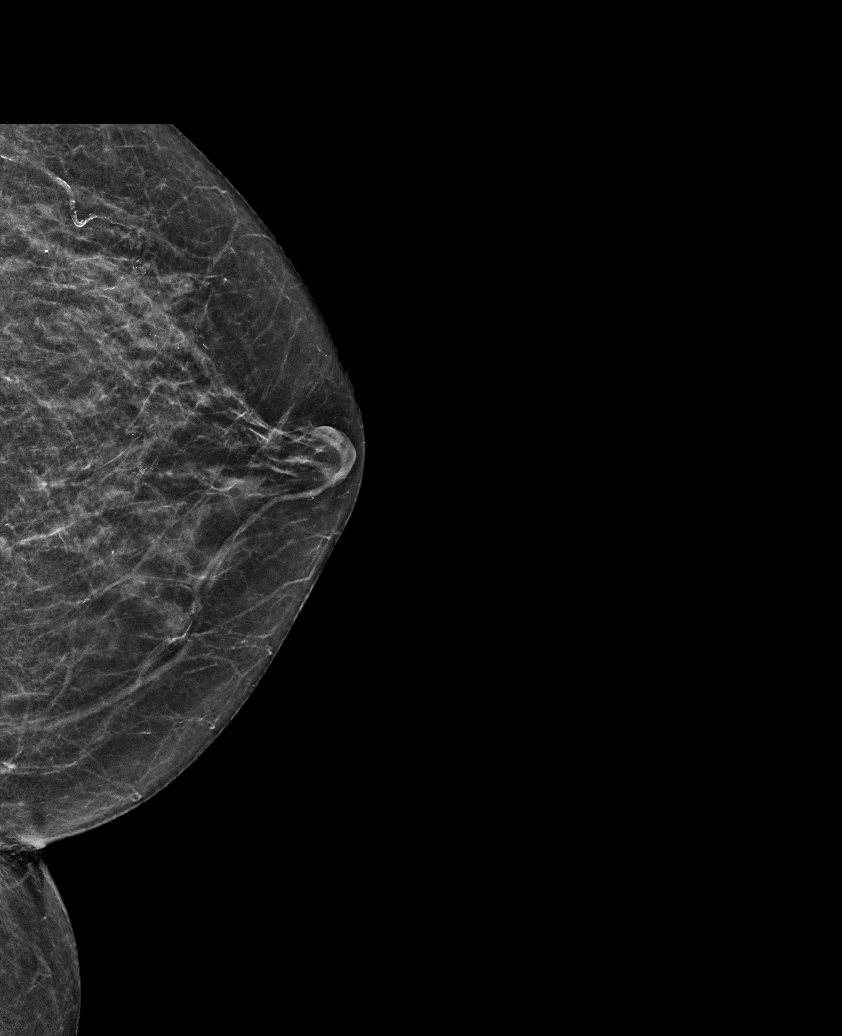

[L MLO synth-2D]
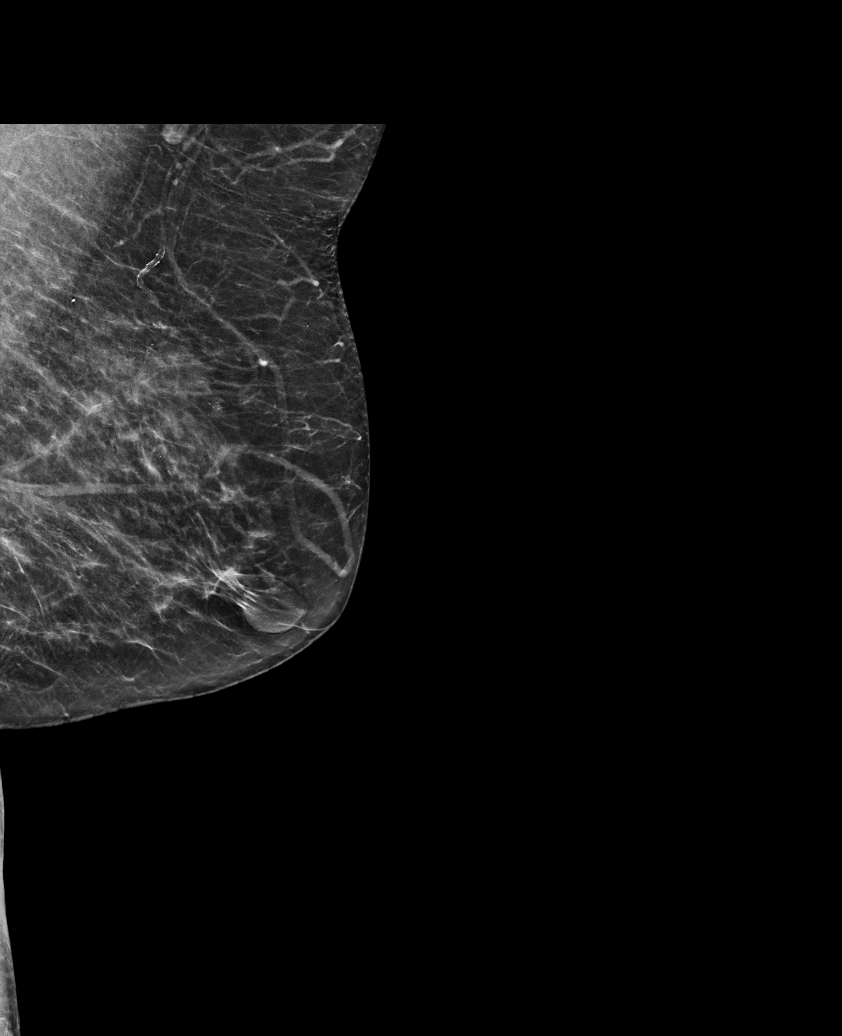

[6 of 36 positions shown; findings below may reference images not displayed]

ACR Breast Density Category b: There are scattered areas of
fibroglandular density.
FINDINGS: There are no findings suspicious for malignancy.
IMPRESSION: No mammographic evidence of malignancy. A result letter of this
screening mammogram will be mailed directly to the patient.

RECOMMENDATION:
Screening mammogram in one year. (Code:51-O-LD2)

BI-RADS CATEGORY  1: Negative.

## 2023-08-15 ENCOUNTER — Ambulatory Visit: Payer: Medicare Other

## 2023-09-03 ENCOUNTER — Ambulatory Visit
Admission: RE | Admit: 2023-09-03 | Discharge: 2023-09-03 | Disposition: A | Discharge status: Home / Self Care | Payer: Medicare Other | Source: Ambulatory Visit | Attending: Family Medicine | Admitting: Family Medicine

## 2023-09-03 DIAGNOSIS — Z23 Encounter for immunization: Secondary | ICD-10-CM | POA: Diagnosis not present

## 2023-09-03 DIAGNOSIS — Z1231 Encounter for screening mammogram for malignant neoplasm of breast: Secondary | ICD-10-CM

## 2023-12-11 DIAGNOSIS — J301 Allergic rhinitis due to pollen: Secondary | ICD-10-CM | POA: Diagnosis not present

## 2023-12-11 DIAGNOSIS — J4540 Moderate persistent asthma, uncomplicated: Secondary | ICD-10-CM | POA: Diagnosis not present

## 2023-12-11 DIAGNOSIS — T783XXD Angioneurotic edema, subsequent encounter: Secondary | ICD-10-CM | POA: Diagnosis not present

## 2023-12-11 DIAGNOSIS — H1045 Other chronic allergic conjunctivitis: Secondary | ICD-10-CM | POA: Diagnosis not present

## 2024-02-10 DIAGNOSIS — Z23 Encounter for immunization: Secondary | ICD-10-CM | POA: Diagnosis not present

## 2024-03-14 DIAGNOSIS — R051 Acute cough: Secondary | ICD-10-CM | POA: Diagnosis not present

## 2024-03-14 DIAGNOSIS — Z8709 Personal history of other diseases of the respiratory system: Secondary | ICD-10-CM | POA: Diagnosis not present

## 2024-03-14 DIAGNOSIS — R5081 Fever presenting with conditions classified elsewhere: Secondary | ICD-10-CM | POA: Diagnosis not present

## 2024-03-14 DIAGNOSIS — Z20822 Contact with and (suspected) exposure to covid-19: Secondary | ICD-10-CM | POA: Diagnosis not present

## 2024-03-30 DIAGNOSIS — J189 Pneumonia, unspecified organism: Secondary | ICD-10-CM | POA: Diagnosis not present

## 2024-03-30 DIAGNOSIS — R000 Tachycardia, unspecified: Secondary | ICD-10-CM | POA: Diagnosis not present

## 2024-04-02 ENCOUNTER — Ambulatory Visit (HOSPITAL_COMMUNITY)
Admission: RE | Admit: 2024-04-02 | Discharge: 2024-04-02 | Disposition: A | Discharge status: Home / Self Care | Source: Ambulatory Visit | Attending: Family Medicine | Admitting: Family Medicine

## 2024-04-02 ENCOUNTER — Other Ambulatory Visit (HOSPITAL_BASED_OUTPATIENT_CLINIC_OR_DEPARTMENT_OTHER): Payer: Self-pay | Admitting: Family Medicine

## 2024-04-02 DIAGNOSIS — J90 Pleural effusion, not elsewhere classified: Secondary | ICD-10-CM | POA: Diagnosis not present

## 2024-04-02 DIAGNOSIS — R918 Other nonspecific abnormal finding of lung field: Secondary | ICD-10-CM | POA: Insufficient documentation

## 2024-04-13 ENCOUNTER — Ambulatory Visit (INDEPENDENT_AMBULATORY_CARE_PROVIDER_SITE_OTHER)

## 2024-04-13 DIAGNOSIS — J4520 Mild intermittent asthma, uncomplicated: Secondary | ICD-10-CM

## 2024-04-13 DIAGNOSIS — R918 Other nonspecific abnormal finding of lung field: Secondary | ICD-10-CM | POA: Diagnosis not present

## 2024-04-13 MED ORDER — BUDESONIDE-FORMOTEROL FUMARATE 80-4.5 MCG/ACT IN AERO: 2.0000 | INHALATION_SPRAY | Freq: Two times a day (BID) | RESPIRATORY_TRACT | 12 refills | Status: AC

## 2024-04-13 NOTE — Patient Instructions (Signed)
  VISIT SUMMARY: Today, you were seen for follow-up on your recent pneumonia diagnosis and evaluation of a lung mass, as well as management of your asthma. You have a history of asthma since childhood and have experienced a recent respiratory illness that was treated with antibiotics. You are feeling much better now and have resumed normal activities.  YOUR PLAN: -RECENT RIGHT LOWER LOBE PNEUMONIA WITH PERSISTENT LUNG INFILTRATE AND SUSPECTED RIGHT LOWER LOBE LUNG MASS: You had pneumonia in the right lower part of your lung, and there is still some concern about a possible lung mass. This could be due to leftover infection or something more serious like a tumor. We will do a follow-up CT scan of your chest in six weeks to see if the issue has resolved. If the CT scan still shows a problem, we will perform a bronchoscopy to take a closer look and possibly take a biopsy.  -ASTHMA: Asthma is a condition where your airways become inflamed and narrow, making it hard to breathe. You have had asthma since childhood and have been using albuterol  as a rescue inhaler. We are prescribing Symbicort as a new maintenance inhaler to use twice daily, and you should rinse your mouth after using it to prevent any infections. Your albuterol  prescription will also be renewed for use when needed.  INSTRUCTIONS: Please schedule a follow-up CT scan of your chest in six weeks. If the CT scan shows any persistent issues, we will plan for a bronchoscopy to further investigate. Continue using your new Symbicort inhaler as prescribed and keep your albuterol  inhaler for rescue use.                      Contains text generated by [employer].                                 Contains text generated by [employer].

## 2024-04-13 NOTE — Progress Notes (Signed)
 Subjective:   PATIENT ID: Angela Sheppard GENDER: female DOB: Jan 17, 1946, MRN: [id number]   HPI Discussed the use of AI scribe software for clinical note transcription with the patient, who gave verbal consent to proceed.  History of Present Illness Angela Sheppard is a 78 year old female with asthma who presents with concerns about a recent pneumonia diagnosis and follow-up for a lung mass. She was referred by her family doctor for evaluation of her asthma and recent pneumonia.  She has a long-standing history of asthma, initially diagnosed in childhood, which was initially exercise-induced but later developed into full-blown asthma. She uses Ventolin  as a rescue inhaler and reports that her lungs have never burned with it, even after frequent use to help clear mucus from her chest. She was previously on Arnuity as a preventive medication but discontinued it due to ineffectiveness during illness. She prefers Flovent as a maintenance inhaler, but insurance issues have led to changes in her medication regimen.  In September, she experienced a respiratory illness characterized by persistent coughing, production of yellow and brownish phlegm, and laryngitis lasting about a week. She initially sought care at an urgent care facility, where a chest x-ray suggested pneumonia. She was treated with doxycycline  and a short course of steroids, which provided some relief. However, her symptoms persisted, leading to a follow-up with her primary care provider, who prescribed levofloxacin, resulting in significant improvement. She experienced nausea while on levofloxacin, which resolved after discontinuation.  She has a history of pneumonia approximately 10-15 years ago, treated successfully with antibiotics. She also has a history of smoking, having quit over 38 years ago after smoking a pack a day for several years.  No recent fever, chills, night sweats, or hemoptysis. Her appetite has improved over the past  three days after stopping levofloxacin. She reports feeling 'a hundred percent better' and has resumed normal activities without coughing.     Past Medical History:  Diagnosis Date   Anemia    Anxiety    Asthma    Cataract    Diverticulosis of colon    Family history of anesthesia complication    very sensitive to gold products, Versed/ Needs to sit up as quickly as possible once out of surgery.   GERD (gastroesophageal reflux disease)    H/O bundle branch block 12-23-12   was told this- Ekg '02 with chart today   Headache(784.0)    History of kidney stones    Hyperlipidemia    Hypertension    Mass of adrenal gland    Osteoarthritis    Osteopenia    PONV (postoperative nausea and vomiting)    Psoriasis    Rash of entire body 12-23-12   12-20-12 awaken with rash, facial swelling, and hives after eating out [employer] and bagel-is lessing but remains   Sleep apnea    borderline-report with chart 8'04-no cpap     Family History  Problem Relation Age of Onset   Colon polyps Sister    Barrett's esophagus Sister    Colon cancer Brother    Coronary artery disease Other    Diabetes Other    Hypertension Other    Stroke Other    Crohn's disease Brother    Breast cancer Other    Bladder Cancer Other    Breast cancer Maternal Grandmother    Breast cancer Cousin    Esophageal cancer Neg Hx    Stomach cancer Neg Hx      Social History  Socioeconomic History   Marital status: Married    Spouse name: Not on file   Number of children: Not on file   Years of education: Not on file   Highest education level: Not on file  Occupational History   Occupation: Retired  Tobacco Use   Smoking status: Former    Current packs/day: 0.00    Average packs/day: 1 pack/day for 25.0 years (25.0 ttl pk-yrs)    Types: Cigarettes    Start date: 12/24/1966    Quit date: 12/24/1991    Years since quitting: 32.3   Smokeless tobacco: Never  Vaping Use   Vaping status: Never Used   Substance and Sexual Activity   Alcohol use: Yes    Alcohol/week: 7.0 standard drinks of alcohol    Types: 7 Glasses of wine per week    Comment: glass of wine every night   Drug use: No   Sexual activity: Yes  Other Topics Concern   Not on file  Social History Narrative   Not on file   Social Drivers of Health   Financial Resource Strain: Not on file  Food Insecurity: Not on file  Transportation Needs: Not on file  Physical Activity: Not on file  Stress: Not on file  Social Connections: Not on file  Intimate Partner Violence: Not on file     Allergies  Allergen Reactions   Other Anaphylaxis    Some injection used at the dentist. See sheet with allergy alert with chart.    Penicillins Rash    Swelling of tongue   Demerol [Meperidine] Other (See Comments)    Hallucinations    Elemental Sulfur     REACTION: whelts   Gold-Containing Drug Products     Gold sodium sulfate: metal/jewelry/medical,dental, plates. causes swelling, redness, itching, fluid filled blisters products with gold sodium thiosulfate   Medrogestone Other (See Comments)    Blacked out   Sulfamethoxazole-Trimethoprim    Versed [Midazolam] Other (See Comments)    Aunt had a back reaction   Cefdinir Rash   Ivp Dye [Iodinated Contrast Media] Rash    Turned red, metal taste in mouth     Outpatient Medications Prior to Visit  Medication Sig Dispense Refill   albuterol  (PROVENTIL  HFA;VENTOLIN  HFA) 108 (90 BASE) MCG/ACT inhaler Inhale 2 puffs into the lungs every 6 (six) hours as needed for wheezing.     albuterol  (PROVENTIL ) (2.5 MG/3ML) 0.083% nebulizer solution Take 3 mLs (2.5 mg total) by nebulization every 4 (four) hours as needed for wheezing or shortness of breath. 75 mL 12   amLODipine (NORVASC) 5 MG tablet Take 5 mg by mouth daily.     aspirin 81 MG tablet Take 81 mg by mouth daily.       calcium carbonate (TUMS - DOSED IN MG ELEMENTAL CALCIUM) 500 MG chewable tablet Chew 1 tablet by mouth daily.      EPINEPHrine (EPIPEN 2-PAK) 0.3 mg/0.3 mL SOAJ Inject 0.3 mg into the muscle once. Yellow jackets     famotidine (PEPCID) 20 MG tablet Take 20 mg by mouth 2 (two) times daily.     fish oil-omega-3 fatty acids 1000 MG capsule Take 1 g by mouth daily.      glucosamine-chondroitin 500-400 MG tablet Take 1 tablet by mouth daily.      losartan  (COZAAR ) 100 MG tablet Take 100 mg by mouth daily.     montelukast  (SINGULAIR ) 10 MG tablet Take 10 mg by mouth at bedtime.     Multiple Vitamin (  MULTIVITAMIN WITH MINERALS) TABS Take 1 tablet by mouth daily.     polyvinyl alcohol (LIQUIFILM TEARS) 1.4 % ophthalmic solution Place 1 drop into both eyes 2 (two) times daily.     simvastatin (ZOCOR) 20 MG tablet Take 20 mg by mouth at bedtime.     fluticasone (FLOVENT HFA) 110 MCG/ACT inhaler Inhale 2 puffs into the lungs 2 (two) times daily.     No facility-administered medications prior to visit.    ROS Reviewed all systems and reported negative except as above     Objective:  There were no vitals filed for this visit.  Physical Exam Physical Exam CHEST: Lungs clear to auscultation, no wheezing, no crackles. CARDIOVASCULAR: Heart sounds normal.     CBC    Component Value Date/Time   WBC 7.9 12/23/2012 1015   RBC 4.03 12/23/2012 1015   HGB 12.7 12/23/2012 1015   HCT 36.3 12/23/2012 1015   PLT 417 (H) 12/23/2012 1015   MCV 90.1 12/23/2012 1015   MCH 31.5 12/23/2012 1015   MCHC 35.0 12/23/2012 1015   RDW 11.9 12/23/2012 1015   LYMPHSABS 2.1 06/05/2011 0957   MONOABS 0.4 06/05/2011 0957   EOSABS 0.2 06/05/2011 0957   BASOSABS 0.0 06/05/2011 0957     Chest imaging:  PFT:     No data to display          Labs:    Echo:       Assessment & Plan:   Assessment and Plan Assessment & Plan Recent right lower lobe pneumonia with persistent lung infiltrate and suspected right lower lobe lung mass Persistent right lower lobe infiltrate post-pneumonia raises suspicion for lung  mass. Differential includes post-infectious changes versus neoplastic process. Improvement with antibiotics suggests resolving pneumonia, but malignancy cannot be excluded without further imaging. - Order follow-up CT scan of the chest in six weeks without contrast to assess resolution of the infiltrate. - Plan bronchoscopy for biopsy if CT shows persistent infiltrate or mass. - Discussed bronchoscopy procedure, including anesthesia and biopsy process, if needed after CT results.  Asthma Long-standing asthma managed with albuterol . Previous maintenance inhalers not well-tolerated or not covered by insurance. Symbicort suggested as alternative maintenance therapy. - Prescribe Symbicort, two puffs twice daily, with instructions to rinse mouth after use to prevent thrush. - Renew albuterol  prescription for rescue use.        Zola Herter, MD [facility] Office: [phone]

## 2024-04-30 DIAGNOSIS — Z23 Encounter for immunization: Secondary | ICD-10-CM | POA: Diagnosis not present

## 2024-05-24 ENCOUNTER — Ambulatory Visit: Admission: RE | Admit: 2024-05-24 | Discharge: 2024-05-24 | Disposition: A | Source: Ambulatory Visit

## 2024-05-24 DIAGNOSIS — R918 Other nonspecific abnormal finding of lung field: Secondary | ICD-10-CM

## 2024-05-24 DIAGNOSIS — J439 Emphysema, unspecified: Secondary | ICD-10-CM | POA: Diagnosis not present

## 2024-05-24 DIAGNOSIS — J4520 Mild intermittent asthma, uncomplicated: Secondary | ICD-10-CM

## 2024-05-24 DIAGNOSIS — R911 Solitary pulmonary nodule: Secondary | ICD-10-CM | POA: Diagnosis not present

## 2024-06-01 ENCOUNTER — Ambulatory Visit

## 2024-06-01 VITALS — BP 140/74 | HR 88 | Temp 98.0°F | Ht 65.0 in | Wt 181.2 lb

## 2024-06-01 DIAGNOSIS — J4520 Mild intermittent asthma, uncomplicated: Secondary | ICD-10-CM

## 2024-06-01 DIAGNOSIS — J189 Pneumonia, unspecified organism: Secondary | ICD-10-CM | POA: Diagnosis not present

## 2024-06-01 DIAGNOSIS — J439 Emphysema, unspecified: Secondary | ICD-10-CM

## 2024-06-01 NOTE — Patient Instructions (Addendum)
  VISIT SUMMARY: Today, we discussed the results of your recent CT scan and your asthma management. The CT scan showed that your pneumonia is improving, and we talked about starting your Symbicort  inhaler for asthma control.  YOUR PLAN: -RECENT RIGHT LOWER LOBE PNEUMONIA, RESOLVING: Your recent pneumonia is getting better, as shown by your CT scan. This means the infection is clearing up. We will continue with your current management and keep an eye on your symptoms.  -ASTHMA: Asthma is a condition where your airways become inflamed and narrow, making it hard to breathe. We discussed starting the Symbicort  inhaler to help manage your asthma. You can use it as needed and as a rescue inhaler. If your symptoms get worse, use two puffs twice daily. Watch for side effects like thrush, hoarseness, and jitteriness. We will follow up in six months or sooner if your symptoms worsen.  INSTRUCTIONS: Please start using the Symbicort  inhaler as discussed. Use it as needed and as a rescue inhaler, and if your symptoms worsen, take two puffs twice daily. Monitor for any side effects such as thrush, hoarseness, or jitteriness. Follow up in six months or sooner if your symptoms worsen.

## 2024-06-01 NOTE — Progress Notes (Signed)
 Subjective:   PATIENT ID: Angela Sheppard GENDER: female DOB: September 07, 1945, MRN: [id number]   HPI Discussed the use of AI scribe software for clinical note transcription with the patient, who gave verbal consent to proceed.  History of Present Illness Angela Sheppard is a 78 year old female with asthma who presents for follow-up after a CT scan and management of asthma.  She recently underwent a CT scan to evaluate a masslike infiltrate, with follow-up CT showing resolution with residual scar.  She has a history of asthma and was prescribed a Symbicort  inhaler. She has not started using it due to concerns about potential side effects, such as thrush and hoarseness. She previously used Flovent, which was not covered by her insurance, and Symbicort  in the past. She is considering using Symbicort  as both a maintenance and rescue inhaler, depending on her symptoms.  Her breathing has improved, allowing her to walk and exercise more, almost reaching her previous activity level before her illness. She uses a path around her house to get her steps in daily and feels her mind is clearer, allowing her to read and retain information better.  She discusses past issues with her HVAC system, which may have exacerbated her asthma symptoms. She has since moved and had the system cleaned and repaired, which she believes has helped improve her symptoms.  She typically experiences asthma symptoms at night, particularly before the HVAC system was cleaned. She reports that she occasionally notices wheezing at bedtime.     Past Medical History:  Diagnosis Date   Anemia    Anxiety    Asthma    Cataract    Diverticulosis of colon    Family history of anesthesia complication    very sensitive to gold products, Versed/ Needs to sit up as quickly as possible once out of surgery.   GERD (gastroesophageal reflux disease)    H/O bundle branch block 12-23-12   was told this- Ekg '02 with chart today    Headache(784.0)    History of kidney stones    Hyperlipidemia    Hypertension    Mass of adrenal gland    Osteoarthritis    Osteopenia    PONV (postoperative nausea and vomiting)    Psoriasis    Rash of entire body 12-23-12   12-20-12 awaken with rash, facial swelling, and hives after eating out [employer] and bagel-is lessing but remains   Sleep apnea    borderline-report with chart 8'04-no cpap     Family History  Problem Relation Age of Onset   Colon polyps Sister    Barrett's esophagus Sister    Colon cancer Brother    Coronary artery disease Other    Diabetes Other    Hypertension Other    Stroke Other    Crohn's disease Brother    Breast cancer Other    Bladder Cancer Other    Breast cancer Maternal Grandmother    Breast cancer Cousin    Esophageal cancer Neg Hx    Stomach cancer Neg Hx      Social History   Socioeconomic History   Marital status: Married    Spouse name: Not on file   Number of children: Not on file   Years of education: Not on file   Highest education level: Not on file  Occupational History   Occupation: Retired  Tobacco Use   Smoking status: Former    Current packs/day: 0.00    Average packs/day: 1 pack/day for 25.0  years (25.0 ttl pk-yrs)    Types: Cigarettes    Start date: 12/24/1966    Quit date: 12/24/1991    Years since quitting: 32.4   Smokeless tobacco: Never  Vaping Use   Vaping status: Never Used  Substance and Sexual Activity   Alcohol use: Yes    Alcohol/week: 7.0 standard drinks of alcohol    Types: 7 Glasses of wine per week    Comment: glass of wine every night   Drug use: No   Sexual activity: Yes  Other Topics Concern   Not on file  Social History Narrative   Not on file   Social Drivers of Health   Financial Resource Strain: Not on file  Food Insecurity: Not on file  Transportation Needs: Not on file  Physical Activity: Not on file  Stress: Not on file  Social Connections: Not on file  Intimate Partner  Violence: Not on file     Allergies  Allergen Reactions   Other Anaphylaxis    Some injection used at the dentist. See sheet with allergy alert with chart.    Penicillins Rash    Swelling of tongue   Demerol [Meperidine] Other (See Comments)    Hallucinations    Elemental Sulfur     REACTION: whelts   Gold-Containing Drug Products     Gold sodium sulfate: metal/jewelry/medical,dental, plates. causes swelling, redness, itching, fluid filled blisters products with gold sodium thiosulfate   Medrogestone Other (See Comments)    Blacked out   Sulfamethoxazole-Trimethoprim    Versed [Midazolam] Other (See Comments)    Aunt had a back reaction   Cefdinir Rash   Ivp Dye [Iodinated Contrast Media] Rash    Turned red, metal taste in mouth     Outpatient Medications Prior to Visit  Medication Sig Dispense Refill   albuterol  (PROVENTIL  HFA;VENTOLIN  HFA) 108 (90 BASE) MCG/ACT inhaler Inhale 2 puffs into the lungs every 6 (six) hours as needed for wheezing.     amLODipine (NORVASC) 5 MG tablet Take 5 mg by mouth daily.     aspirin 81 MG tablet Take 81 mg by mouth daily.       budesonide -formoterol  (SYMBICORT ) 80-4.5 MCG/ACT inhaler Inhale 2 puffs into the lungs in the morning and at bedtime. 10.2 g 12   calcium carbonate (TUMS - DOSED IN MG ELEMENTAL CALCIUM) 500 MG chewable tablet Chew 1 tablet by mouth daily.     famotidine (PEPCID) 20 MG tablet Take 20 mg by mouth 2 (two) times daily.     glucosamine-chondroitin 500-400 MG tablet Take 1 tablet by mouth daily.      losartan  (COZAAR ) 100 MG tablet Take 100 mg by mouth daily.     montelukast  (SINGULAIR ) 10 MG tablet Take 10 mg by mouth at bedtime.     Multiple Vitamin (MULTIVITAMIN WITH MINERALS) TABS Take 1 tablet by mouth daily.     polyvinyl alcohol (LIQUIFILM TEARS) 1.4 % ophthalmic solution Place 1 drop into both eyes 2 (two) times daily.     simvastatin (ZOCOR) 20 MG tablet Take 20 mg by mouth at bedtime.     albuterol  (PROVENTIL )  (2.5 MG/3ML) 0.083% nebulizer solution Take 3 mLs (2.5 mg total) by nebulization every 4 (four) hours as needed for wheezing or shortness of breath. 75 mL 12   EPINEPHrine (EPIPEN 2-PAK) 0.3 mg/0.3 mL SOAJ Inject 0.3 mg into the muscle once. Yellow jackets (Patient not taking: Reported on 06/01/2024)     fish oil-omega-3 fatty acids 1000 MG capsule  Take 1 g by mouth daily.  (Patient not taking: Reported on 06/01/2024)     No facility-administered medications prior to visit.    ROS Reviewed all systems and reported negative except as above     Objective:   Vitals:   06/01/24 1409  BP: (!) 140/74  Pulse: 88  Temp: 98 F (36.7 C)  SpO2: 100%  Weight: 181 lb 3.2 oz (82.2 kg)  Height: 5' 5 (1.651 m)    Physical Exam Physical Exam GENERAL: Appropriate to age, no acute distress. HEAD EYES EARS NOSE THROAT: Moist mucous membranes, atraumatic, normocephalic. CHEST: Clear to auscultation bilaterally, no wheezing, no crackles, no rales. CARDIAC: Regular rate and rhythm, normal S1, normal S2, no murmurs, no rubs, no gallops. ABDOMEN: Soft, nontender. NEUROLOGICAL: Motor and sensation grossly intact, alert and oriented times X 3. EXTREMITIES: Warm, well perfused, no edema.     CBC    Component Value Date/Time   WBC 7.9 12/23/2012 1015   RBC 4.03 12/23/2012 1015   HGB 12.7 12/23/2012 1015   HCT 36.3 12/23/2012 1015   PLT 417 (H) 12/23/2012 1015   MCV 90.1 12/23/2012 1015   MCH 31.5 12/23/2012 1015   MCHC 35.0 12/23/2012 1015   RDW 11.9 12/23/2012 1015   LYMPHSABS 2.1 06/05/2011 0957   MONOABS 0.4 06/05/2011 0957   EOSABS 0.2 06/05/2011 0957   BASOSABS 0.0 06/05/2011 0957     Chest imaging: I reviewed her CT chest performed on 05/24/2024 which shows resolution of her prior masslike cavitary consolidation with residual scarring.  Evidence of upper lobe predominant emphysema         Assessment & Plan:   Assessment and Plan Assessment & Plan Recent right lower lobe  pneumonia, resolving Pneumonia resolving with CT showing improvement, indicating infection rather than malignancy. - Continue current management and monitor symptoms.  Asthma versus emphysema Discussed management. She has not started Symbicort  due to concerns about side effects. Reports improved symptoms and activity levels, possibly due to HVAC repairs. - Start Symbicort  inhaler as needed, with option as rescue inhaler. - Use Symbicort  two puffs twice daily if symptoms worsen. - Monitor for side effects: thrush, hoarseness, jitteriness. - Follow up in six months or sooner if symptoms worsen. - Plan to obtain PFTs on her next follow-up to evaluate for COPD - Former smoker quit in the 1990s       Zola Herter, MD [facility] Office: [phone]

## 2024-11-30 ENCOUNTER — Ambulatory Visit
